# Patient Record
Sex: Female | Born: 1960
Health system: Southern US, Community
[De-identification: ages and names within clinical notes are randomized; demographics above are authoritative.]

## PROBLEM LIST (undated history)

## (undated) DIAGNOSIS — E119 Type 2 diabetes mellitus without complications: Secondary | ICD-10-CM

## (undated) DIAGNOSIS — K219 Gastro-esophageal reflux disease without esophagitis: Secondary | ICD-10-CM

## (undated) DIAGNOSIS — Z9889 Other specified postprocedural states: Secondary | ICD-10-CM

## (undated) DIAGNOSIS — M1712 Unilateral primary osteoarthritis, left knee: Secondary | ICD-10-CM

## (undated) DIAGNOSIS — Z8489 Family history of other specified conditions: Secondary | ICD-10-CM

## (undated) DIAGNOSIS — M1990 Unspecified osteoarthritis, unspecified site: Secondary | ICD-10-CM

## (undated) DIAGNOSIS — K279 Peptic ulcer, site unspecified, unspecified as acute or chronic, without hemorrhage or perforation: Secondary | ICD-10-CM

## (undated) DIAGNOSIS — D649 Anemia, unspecified: Secondary | ICD-10-CM

## (undated) DIAGNOSIS — R112 Nausea with vomiting, unspecified: Secondary | ICD-10-CM

## (undated) DIAGNOSIS — R202 Paresthesia of skin: Secondary | ICD-10-CM

## (undated) DIAGNOSIS — R200 Anesthesia of skin: Secondary | ICD-10-CM

## (undated) DIAGNOSIS — R160 Hepatomegaly, not elsewhere classified: Secondary | ICD-10-CM

## (undated) DIAGNOSIS — R002 Palpitations: Secondary | ICD-10-CM

## (undated) DIAGNOSIS — I10 Essential (primary) hypertension: Secondary | ICD-10-CM

## (undated) DIAGNOSIS — B9681 Helicobacter pylori [H. pylori] as the cause of diseases classified elsewhere: Secondary | ICD-10-CM

## (undated) DIAGNOSIS — K760 Fatty (change of) liver, not elsewhere classified: Secondary | ICD-10-CM

## (undated) HISTORY — PX: JOINT REPLACEMENT: SHX530

## (undated) HISTORY — PX: MOUTH SURGERY: SHX715

## (undated) HISTORY — PX: TUBAL LIGATION: SHX77

## (undated) HISTORY — PX: COLONOSCOPY: SHX174

## (undated) HISTORY — PX: BACK SURGERY: SHX140

## (undated) HISTORY — PX: CERVICAL FUSION: SHX112

## (undated) HISTORY — PX: TOTAL HIP ARTHROPLASTY: SHX124

## (undated) SURGERY — Surgical Case
Anesthesia: *Unknown

---

## 2006-05-05 ENCOUNTER — Ambulatory Visit: Payer: Self-pay | Admitting: Cardiology

## 2006-05-13 ENCOUNTER — Ambulatory Visit: Payer: Self-pay | Admitting: Cardiology

## 2006-05-17 ENCOUNTER — Ambulatory Visit: Payer: Self-pay | Admitting: Cardiology

## 2007-05-25 ENCOUNTER — Encounter: Admission: RE | Admit: 2007-05-25 | Discharge: 2007-05-25 | Payer: Self-pay | Admitting: Sports Medicine

## 2007-11-22 ENCOUNTER — Encounter: Admission: RE | Admit: 2007-11-22 | Discharge: 2007-11-22 | Payer: Self-pay | Admitting: Sports Medicine

## 2008-06-19 ENCOUNTER — Inpatient Hospital Stay (HOSPITAL_COMMUNITY): Admission: RE | Admit: 2008-06-19 | Discharge: 2008-06-24 | Payer: Self-pay | Admitting: Orthopedic Surgery

## 2009-07-05 ENCOUNTER — Ambulatory Visit: Payer: Self-pay | Admitting: Diagnostic Radiology

## 2009-07-05 ENCOUNTER — Emergency Department (HOSPITAL_BASED_OUTPATIENT_CLINIC_OR_DEPARTMENT_OTHER): Admission: EM | Admit: 2009-07-05 | Discharge: 2009-07-05 | Payer: Self-pay | Admitting: Emergency Medicine

## 2011-03-08 LAB — POCT CARDIAC MARKERS: CKMB, poc: <1 ng/mL — ABNORMAL LOW (ref 1.0–8.0)

## 2011-03-08 LAB — URINALYSIS, ROUTINE W REFLEX MICROSCOPIC
Bilirubin Urine: NEGATIVE
Ketones, ur: NEGATIVE mg/dL
Protein, ur: NEGATIVE mg/dL
Urobilinogen, UA: 0.2 mg/dL (ref 0.0–1.0)
pH: 6.5 (ref 5.0–8.0)

## 2011-03-08 LAB — CBC
Hemoglobin: 12.3 g/dL (ref 12.0–15.0)
MCHC: 33.3 g/dL (ref 30.0–36.0)
MCV: 88.4 fL (ref 78.0–100.0)
RBC: 4.17 MIL/uL (ref 3.87–5.11)
RDW: 13.3 % (ref 11.5–15.5)
WBC: 4.9 10*3/uL (ref 4.0–10.5)

## 2011-03-08 LAB — BASIC METABOLIC PANEL
CO2: 26 mEq/L (ref 19–32)
Chloride: 109 mEq/L (ref 96–112)
Creatinine, Ser: 0.8 mg/dL (ref 0.4–1.2)
GFR calc Af Amer: >60 mL/min (ref >60)
GFR calc non Af Amer: >60 mL/min (ref >60)

## 2011-03-08 LAB — URINE MICROSCOPIC-ADD ON

## 2011-03-08 LAB — POCT TOXICOLOGY PANEL

## 2011-03-08 LAB — DIFFERENTIAL
Basophils Absolute: 0 10*3/uL (ref 0.0–0.1)
Basophils Relative: 1 % (ref 0–1)
Eosinophils Absolute: 0.1 10*3/uL (ref 0.0–0.7)
Eosinophils Relative: 1 % (ref 0–5)
Lymphs Abs: 1.8 10*3/uL (ref 0.7–4.0)
Monocytes Absolute: 0.4 10*3/uL (ref 0.1–1.0)
Monocytes Relative: 8 % (ref 3–12)

## 2011-03-20 ENCOUNTER — Encounter: Payer: Self-pay | Admitting: Advanced Practice Midwife

## 2011-04-08 ENCOUNTER — Encounter: Payer: Self-pay | Admitting: Obstetrics and Gynecology

## 2011-04-14 NOTE — Discharge Summary (Signed)
Donna Casey NO.:  [id number]   MEDICAL RECORD NO.:  [id number]          PATIENT TYPE:  INP   LOCATION:  5009                         FACILITY:  [facility]   PHYSICIAN:  Eulas Post, MD    DATE OF BIRTH:  [birth year]   DATE OF ADMISSION:  06/19/2008  DATE OF DISCHARGE:  06/24/2008                               DISCHARGE SUMMARY   ADMISSION DIAGNOSIS:  Right hip posttraumatic arthritis.   DISCHARGE DIAGNOSES:  1. Right hip posttraumatic osteoarthritis.  2. Acute postoperative blood loss anemia.  3. Persistent tachycardia of unknown etiology.   DISCHARGE MEDICATIONS:  1. Coumadin 2.5 mg p.o. daily for a period of 1 month postoperatively      to be managed by Home Health Physical Therapy.  2. Phenergan.  3. Percocet.  4. Valium p.r.n.  5. Hydrochlorothiazide 1/2 tablet p.o. daily.   HOSPITAL COURSE:  Donna Casey is a 50 year old woman who had an  acetabular fracture and subsequently developed arthritis and elected to  undergo a right total hip arthroplasty.  She tolerated the procedure  well and postoperatively developed postoperative hemorrhagic anemia.  She had a blood transfusion for a hemoglobin of 8 and a hematocrit of 25  with a pulse of 125 and symptoms of dizziness and fatigue.  After her  transfusion, she felt better and her hemoglobin and hematocrit were 9  and 28.  She continued to have persistent tachycardia, which actually  was consistent with her preoperative exam with pulse ranging from the  110 to even as high as 135.  She never had any symptoms or had any chest  pain.  She was given Coumadin for DVT prophylaxis as well as Ancef for  antimicrobial prophylaxis.  Her dressings were changed on postoperative  day #3 and they were clean, dry, and intact.  She made slow progress  with physical therapy and is planned to be discharged home,  weightbearing as tolerated with Home Health and Physical Therapy.  She  initially had a PCA and  subsequently switched to p.o. analgesics.  I am  going to plan to see her again in 2 weeks in my office.  She has done  very well so far and she benefited maximally from her hospital stay.      Eulas Post, MD  Electronically Signed     JPL/MEDQ  D:  06/22/2008  T:  06/23/2008  Job:  [id number]

## 2011-04-14 NOTE — Op Note (Signed)
Donna Casey, Donna Casey NO.:  [id number]   MEDICAL RECORD NO.:  [id number]          PATIENT TYPE:  INP   LOCATION:  2550                         FACILITY:  [facility]   PHYSICIAN:  Eulas Post, MD    DATE OF BIRTH:  [birth year]   DATE OF PROCEDURE:  06/19/2008  DATE OF DISCHARGE:                               OPERATIVE REPORT   PREOPERATIVE DIAGNOSIS:  Right hip post-traumatic osteoarthritis.   POSTOPERATIVE DIAGNOSIS:  Right hip post-traumatic osteoarthritis.   OPERATIVE PROCEDURE:  Right total hip arthroplasty.   ATTENDING PHYSICIAN:  Eulas Post, MD   FIRST ASSISTANT:  Skip Mayer, PA-C   ANESTHESIA:  General.   ESTIMATED BLOOD LOSS:  450 mL.   OPERATIVE IMPLANT:  A DePuy Pinnacle 100 acetabular cup, size 54 with a  Marathon acetabular liner plus four 10-degree lip size 54 x 36 with a  size 5 Summit tapered femoral stem HA along with a size 36-mm plus 1.5  metal-on-metal Articuleze femoral head with a hole eliminator for the  acetabulum.   PREOPERATIVE INDICATIONS:  Mrs. Donna Casey is a 50 year old woman  who had an acetabular fracture and subsequently developed severe post-  traumatic arthritis and pain.  She elected to undergo the above-named  procedures.  Risks, benefits, and alternatives to operative intervention  were discussed with her and her family preoperatively including not  limited to risks of infection, bleeding, nerve injury, fracture, the  need for revision surgery, leg-length discrepancy, failure of operative  implant, cardiopulmonary complications, among others, and she is willing  to proceed.   OPERATIVE PROCEDURE:  The patient was brought to the operating room,  placed in the supine position.  General anesthesia was administered.  1  g intravenous Ancef was given.  The patient was turned into the lateral  decubitus position and the right lower extremity was prepped and draped  in the usual sterile fashion.  Standard  approach was performed.  The  capsule was tacked with Ethibond and FiberWire and the piriformis was  also tacked.  Posterolateral approach was performed.  We then dislocated  the hip and took a measurement before dislocating the hip which to an  arbitrary reference was 10.5.  At the end of the case that length  measurement was then 11.0 mm indicating that we had lengthened our goal  amount of approximately 5 mm.   She had a fairly impressive amount of adipose tissue and it was a very  deep hole.  We performed our femoral neck resection using the  appropriate template.  We then exposed the acetabulum and removed the  ossified labrum.  There was a moderate amount of distortion of the  osseous anatomy due to her previous fracture, and she had a falsely  lateralized acetabulum due to some mild heterotopic ossification around  the rim of the acetabulum.  We removed all of this with rongeurs as well  as osteotomes in order to get back to a normal acetabulum as best as  possible.  We reamed medially in order to reach the inner wall and then  reamed up to a size  53 reamer and trialed the 54 cup.  Excellent  fixation was achieved with the trial liner, so we decided to go with a  implant without any holes.   We impacted our implant into the appropriate position with appropriate  anteversion and inclination.  We had excellent stability of the implant  and then placed a trial lipped liner and turned our attention to the  femur.   We exposed the femur and lateralized after removing the lateral bone of  the trochanter in order to get the appropriate position.  We  sequentially lateralized up to a size 5.  We then broached up to a size  5 and trialed with the 5.  With +1.5 length, we had excellent stability  in both in anterior and posterior positions.   We then impacted the 5 femoral stem into place and then trialed one last  time with the ball and it had excellent stability.  The stem seated   completely.  There were no calcar cracks.  We then removed the trial  liner for the acetabulum and placed the final liner with the lip in the  posterior superior position.  We then impacted our metal femoral head  and reduced the hip and took it through one final range of motion.  It  was found to have excellent stability.  We irrigated the wounds  copiously and closed the capsule with interrupted FiberWire as well as  Ethibond and sewed the capsular piriformis external rotator sleeve  through the greater trochanter through drill holes using the smallest  drill possible.  We then closed the fascia with #1 Vicryl followed by 2-  0 and 3-0 Vicryl for the subcutaneous tissue and a Monocryl for the  skin.  The wounds were injected.  Steri-Strips were placed.  The patient  was placed in an abduction pillow and sterile gauze applied.  The  patient was awakened and extubated and returned to PACU in stable and  satisfactory condition.  There were no complications and the patient  tolerated the procedure well.      Eulas Post, MD  Electronically Signed     JPL/MEDQ  D:  06/19/2008  T:  06/20/2008  Job:  [id number]

## 2011-05-20 ENCOUNTER — Encounter: Payer: Self-pay | Admitting: Obstetrics and Gynecology

## 2011-08-28 LAB — ABO/RH: ABO/RH(D): O POS

## 2011-08-28 LAB — PROTIME-INR
INR: 1.1
INR: 1.1
INR: 1.4
INR: 1.7 — ABNORMAL HIGH
INR: 2 — ABNORMAL HIGH
Prothrombin Time: 17.4 — ABNORMAL HIGH
Prothrombin Time: 20.9 — ABNORMAL HIGH
Prothrombin Time: 23.8 — ABNORMAL HIGH
Prothrombin Time: 24.6 — ABNORMAL HIGH

## 2011-08-28 LAB — CBC
HCT: 25.4 — ABNORMAL LOW
Hemoglobin: 12.5
Hemoglobin: 8.6 — ABNORMAL LOW
Hemoglobin: 9 — ABNORMAL LOW
MCHC: 34.1
MCHC: 34.8
MCV: 87.1
MCV: 87.3
Platelets: 273
Platelets: 294
RBC: 2.91 — ABNORMAL LOW
RBC: 3.24 — ABNORMAL LOW
RBC: 4.15
RDW: 13.8
RDW: 14.1
WBC: 10.1
WBC: 10.4
WBC: 9.9

## 2011-08-28 LAB — BASIC METABOLIC PANEL
BUN: 10
CO2: 22
Calcium: 7.6 — ABNORMAL LOW
Calcium: 7.8 — ABNORMAL LOW
Chloride: 101
Chloride: 103
Creatinine, Ser: 1.02
GFR calc Af Amer: >60
GFR calc Af Amer: >60
GFR calc Af Amer: >60
GFR calc non Af Amer: 58 — ABNORMAL LOW
GFR calc non Af Amer: 59 — ABNORMAL LOW
GFR calc non Af Amer: >60
GFR calc non Af Amer: >60
Glucose, Bld: 134 — ABNORMAL HIGH
Glucose, Bld: 156 — ABNORMAL HIGH
Glucose, Bld: 93
Potassium: 3.6
Potassium: 3.8
Sodium: 126 — ABNORMAL LOW

## 2011-08-28 LAB — CROSSMATCH: Antibody Screen: NEGATIVE

## 2011-08-28 LAB — TYPE AND SCREEN: Antibody Screen: NEGATIVE

## 2016-03-20 DIAGNOSIS — R42 Dizziness and giddiness: Secondary | ICD-10-CM | POA: Diagnosis not present

## 2016-03-20 DIAGNOSIS — I10 Essential (primary) hypertension: Secondary | ICD-10-CM | POA: Diagnosis not present

## 2016-03-20 DIAGNOSIS — Z833 Family history of diabetes mellitus: Secondary | ICD-10-CM | POA: Diagnosis not present

## 2016-03-20 DIAGNOSIS — R0789 Other chest pain: Secondary | ICD-10-CM | POA: Diagnosis not present

## 2016-03-20 DIAGNOSIS — Z841 Family history of disorders of kidney and ureter: Secondary | ICD-10-CM | POA: Diagnosis not present

## 2016-03-20 DIAGNOSIS — M549 Dorsalgia, unspecified: Secondary | ICD-10-CM | POA: Diagnosis not present

## 2016-03-20 DIAGNOSIS — R51 Headache: Secondary | ICD-10-CM | POA: Diagnosis not present

## 2016-03-20 DIAGNOSIS — R079 Chest pain, unspecified: Secondary | ICD-10-CM | POA: Diagnosis not present

## 2016-03-20 DIAGNOSIS — Z8249 Family history of ischemic heart disease and other diseases of the circulatory system: Secondary | ICD-10-CM | POA: Diagnosis not present

## 2016-03-20 DIAGNOSIS — Z96649 Presence of unspecified artificial hip joint: Secondary | ICD-10-CM | POA: Diagnosis not present

## 2016-03-20 DIAGNOSIS — Z809 Family history of malignant neoplasm, unspecified: Secondary | ICD-10-CM | POA: Diagnosis not present

## 2016-03-20 DIAGNOSIS — Z79899 Other long term (current) drug therapy: Secondary | ICD-10-CM | POA: Diagnosis not present

## 2016-03-20 DIAGNOSIS — R200 Anesthesia of skin: Secondary | ICD-10-CM | POA: Diagnosis not present

## 2016-03-20 DIAGNOSIS — M542 Cervicalgia: Secondary | ICD-10-CM | POA: Diagnosis not present

## 2016-03-20 DIAGNOSIS — Z825 Family history of asthma and other chronic lower respiratory diseases: Secondary | ICD-10-CM | POA: Diagnosis not present

## 2016-03-23 DIAGNOSIS — Z6838 Body mass index (BMI) 38.0-38.9, adult: Secondary | ICD-10-CM | POA: Diagnosis not present

## 2016-03-23 DIAGNOSIS — M940 Chondrocostal junction syndrome [Tietze]: Secondary | ICD-10-CM | POA: Diagnosis not present

## 2016-03-23 DIAGNOSIS — R072 Precordial pain: Secondary | ICD-10-CM | POA: Diagnosis not present

## 2016-03-23 DIAGNOSIS — M542 Cervicalgia: Secondary | ICD-10-CM | POA: Diagnosis not present

## 2016-04-06 DIAGNOSIS — R072 Precordial pain: Secondary | ICD-10-CM | POA: Diagnosis not present

## 2016-04-06 DIAGNOSIS — M542 Cervicalgia: Secondary | ICD-10-CM | POA: Diagnosis not present

## 2016-04-06 DIAGNOSIS — M940 Chondrocostal junction syndrome [Tietze]: Secondary | ICD-10-CM | POA: Diagnosis not present

## 2016-04-06 DIAGNOSIS — I10 Essential (primary) hypertension: Secondary | ICD-10-CM | POA: Diagnosis not present

## 2016-04-06 DIAGNOSIS — Z1389 Encounter for screening for other disorder: Secondary | ICD-10-CM | POA: Diagnosis not present

## 2016-04-24 DIAGNOSIS — I10 Essential (primary) hypertension: Secondary | ICD-10-CM | POA: Diagnosis not present

## 2016-04-24 DIAGNOSIS — R739 Hyperglycemia, unspecified: Secondary | ICD-10-CM | POA: Diagnosis not present

## 2016-04-29 DIAGNOSIS — Z1212 Encounter for screening for malignant neoplasm of rectum: Secondary | ICD-10-CM | POA: Diagnosis not present

## 2016-04-29 DIAGNOSIS — N63 Unspecified lump in breast: Secondary | ICD-10-CM | POA: Diagnosis not present

## 2016-04-29 DIAGNOSIS — Z23 Encounter for immunization: Secondary | ICD-10-CM | POA: Diagnosis not present

## 2016-04-29 DIAGNOSIS — Z0000 Encounter for general adult medical examination without abnormal findings: Secondary | ICD-10-CM | POA: Diagnosis not present

## 2016-04-29 DIAGNOSIS — Z6838 Body mass index (BMI) 38.0-38.9, adult: Secondary | ICD-10-CM | POA: Diagnosis not present

## 2016-05-11 DIAGNOSIS — R7301 Impaired fasting glucose: Secondary | ICD-10-CM | POA: Diagnosis not present

## 2016-05-11 DIAGNOSIS — D250 Submucous leiomyoma of uterus: Secondary | ICD-10-CM | POA: Diagnosis not present

## 2016-05-11 DIAGNOSIS — R1310 Dysphagia, unspecified: Secondary | ICD-10-CM | POA: Diagnosis not present

## 2016-05-11 DIAGNOSIS — Z1212 Encounter for screening for malignant neoplasm of rectum: Secondary | ICD-10-CM | POA: Diagnosis not present

## 2016-05-11 DIAGNOSIS — K219 Gastro-esophageal reflux disease without esophagitis: Secondary | ICD-10-CM | POA: Diagnosis not present

## 2016-05-11 DIAGNOSIS — D252 Subserosal leiomyoma of uterus: Secondary | ICD-10-CM | POA: Diagnosis not present

## 2016-05-11 DIAGNOSIS — Z0000 Encounter for general adult medical examination without abnormal findings: Secondary | ICD-10-CM | POA: Diagnosis not present

## 2016-06-08 DIAGNOSIS — N920 Excessive and frequent menstruation with regular cycle: Secondary | ICD-10-CM | POA: Diagnosis not present

## 2016-06-08 DIAGNOSIS — D250 Submucous leiomyoma of uterus: Secondary | ICD-10-CM | POA: Diagnosis not present

## 2016-06-15 DIAGNOSIS — D259 Leiomyoma of uterus, unspecified: Secondary | ICD-10-CM | POA: Diagnosis not present

## 2016-06-15 DIAGNOSIS — Z6837 Body mass index (BMI) 37.0-37.9, adult: Secondary | ICD-10-CM | POA: Diagnosis not present

## 2016-06-19 DIAGNOSIS — Z6838 Body mass index (BMI) 38.0-38.9, adult: Secondary | ICD-10-CM | POA: Diagnosis not present

## 2016-06-19 DIAGNOSIS — R1084 Generalized abdominal pain: Secondary | ICD-10-CM | POA: Diagnosis not present

## 2016-06-22 ENCOUNTER — Other Ambulatory Visit: Payer: Self-pay | Admitting: Obstetrics and Gynecology

## 2016-06-23 ENCOUNTER — Other Ambulatory Visit: Payer: Self-pay | Admitting: Obstetrics and Gynecology

## 2016-06-23 DIAGNOSIS — R1084 Generalized abdominal pain: Secondary | ICD-10-CM | POA: Diagnosis not present

## 2016-06-29 NOTE — Patient Instructions (Addendum)
Your procedure is scheduled on:  Tuesday, July 14, 2016  Enter through the Main Entrance of [facility] at:  10:45 AM  Pick up the phone at the desk and dial [phone].  Call this number if you have problems the morning of surgery: [phone].  Remember: Do NOT eat food:  After Midnight Monday  Do NOT drink clear liquids after:  8:00 AM day of surgery  Take these medicines the morning of surgery with a SIP OF WATER: Blood pressure medications  Do NOT wear jewelry (body piercing), metal hair clips/bobby pins, make-up, or nail polish. Do NOT wear lotions, powders, or perfumes.  You may wear deodorant. Do NOT shave for 48 hours prior to surgery. Do NOT bring valuables to the hospital. Contacts, dentures, or bridgework may not be worn into surgery.  Have a responsible adult drive you home and stay with you for 24 hours after your procedure

## 2016-06-30 ENCOUNTER — Encounter (HOSPITAL_COMMUNITY): Payer: Self-pay

## 2016-06-30 ENCOUNTER — Encounter (HOSPITAL_COMMUNITY)
Admission: RE | Admit: 2016-06-30 | Discharge: 2016-06-30 | Disposition: A | Discharge status: Home / Self Care | Payer: BLUE CROSS/BLUE SHIELD | Source: Ambulatory Visit | Attending: Obstetrics and Gynecology | Admitting: Obstetrics and Gynecology

## 2016-06-30 DIAGNOSIS — Z01812 Encounter for preprocedural laboratory examination: Secondary | ICD-10-CM | POA: Insufficient documentation

## 2016-06-30 HISTORY — DX: Essential (primary) hypertension: I10

## 2016-06-30 HISTORY — DX: Anesthesia of skin: R20.0

## 2016-06-30 HISTORY — DX: Anemia, unspecified: D64.9

## 2016-06-30 HISTORY — DX: Peptic ulcer, site unspecified, unspecified as acute or chronic, without hemorrhage or perforation: K27.9

## 2016-06-30 HISTORY — DX: Other specified postprocedural states: Z98.890

## 2016-06-30 HISTORY — DX: Peptic ulcer, site unspecified, unspecified as acute or chronic, without hemorrhage or perforation: B96.81

## 2016-06-30 HISTORY — DX: Hepatomegaly, not elsewhere classified: R16.0

## 2016-06-30 HISTORY — DX: Anesthesia of skin: R20.2

## 2016-06-30 HISTORY — DX: Gastro-esophageal reflux disease without esophagitis: K21.9

## 2016-06-30 HISTORY — DX: Palpitations: R00.2

## 2016-06-30 HISTORY — DX: Other specified postprocedural states: R11.2

## 2016-06-30 LAB — BASIC METABOLIC PANEL
Anion gap: 4 — ABNORMAL LOW (ref 5–15)
BUN: 11 mg/dL (ref 6–20)
CHLORIDE: 105 mmol/L (ref 101–111)
CO2: 28 mmol/L (ref 22–32)
CREATININE: 0.82 mg/dL (ref 0.44–1.00)
Calcium: 8.8 mg/dL — ABNORMAL LOW (ref 8.9–10.3)
GFR calc Af Amer: >60 mL/min (ref >60)
GLUCOSE: 136 mg/dL — AB (ref 65–99)
POTASSIUM: 3.6 mmol/L (ref 3.5–5.1)
SODIUM: 137 mmol/L (ref 135–145)

## 2016-06-30 LAB — CBC
HCT: 35.6 % — ABNORMAL LOW (ref 36.0–46.0)
Hemoglobin: 11.3 g/dL — ABNORMAL LOW (ref 12.0–15.0)
MCH: 27.9 pg (ref 26.0–34.0)
MCHC: 31.7 g/dL (ref 30.0–36.0)
MCV: 87.9 fL (ref 78.0–100.0)
PLATELETS: 354 10*3/uL (ref 150–400)
RBC: 4.05 MIL/uL (ref 3.87–5.11)
RDW: 14 % (ref 11.5–15.5)
WBC: 8.4 10*3/uL (ref 4.0–10.5)

## 2016-07-13 NOTE — Anesthesia Preprocedure Evaluation (Addendum)
Anesthesia Evaluation  Patient identified by MRN, date of birth, ID band Patient awake    Reviewed: Allergy & Precautions, NPO status , Patient's Chart, lab work & pertinent test results  History of Anesthesia Complications (+) PONV and history of anesthetic complications  Airway Mallampati: I  TM Distance: >3 FB Neck ROM: Full    Dental  (+) Dental Advisory Given, Teeth Intact   Pulmonary neg pulmonary ROS,    Pulmonary exam normal breath sounds clear to auscultation       Cardiovascular hypertension, Pt. on medications (-) angina(-) Past MI, (-) Cardiac Stents and (-) CABG + dysrhythmias (palpitations)  Rhythm:Regular Rate:Normal     Neuro/Psych neg Seizures Numbness and tingling in right hand     GI/Hepatic PUD, GERD  Medicated and Controlled,Hepatomegaly   Endo/Other  negative endocrine ROS  Renal/GU negative Renal ROS     Musculoskeletal   Abdominal (+) + obese,   Peds  Hematology  (+) Blood dyscrasia, anemia ,   Anesthesia Other Findings   Reproductive/Obstetrics                            Anesthesia Physical Anesthesia Plan  ASA: II  Anesthesia Plan: General   Post-op Pain Management:    Induction: Intravenous  Airway Management Planned: LMA  Additional Equipment:   Intra-op Plan:   Post-operative Plan: Extubation in OR  Informed Consent: I have reviewed the patients History and Physical, chart, labs and discussed the procedure including the risks, benefits and alternatives for the proposed anesthesia with the patient or authorized representative who has indicated his/her understanding and acceptance.   Dental advisory given  Plan Discussed with:   Anesthesia Plan Comments:        Anesthesia Quick Evaluation

## 2016-07-14 ENCOUNTER — Ambulatory Visit (HOSPITAL_COMMUNITY)
Admission: RE | Admit: 2016-07-14 | Discharge: 2016-07-14 | Disposition: A | Discharge status: Home / Self Care | Payer: BLUE CROSS/BLUE SHIELD | Source: Ambulatory Visit | Attending: Obstetrics and Gynecology | Admitting: Obstetrics and Gynecology

## 2016-07-14 ENCOUNTER — Encounter (HOSPITAL_COMMUNITY)
Admission: RE | Disposition: A | Discharge status: Home / Self Care | Payer: Self-pay | Source: Ambulatory Visit | Attending: Obstetrics and Gynecology

## 2016-07-14 ENCOUNTER — Ambulatory Visit (HOSPITAL_COMMUNITY): Payer: BLUE CROSS/BLUE SHIELD | Admitting: Anesthesiology

## 2016-07-14 ENCOUNTER — Encounter (HOSPITAL_COMMUNITY): Payer: Self-pay | Admitting: Anesthesiology

## 2016-07-14 DIAGNOSIS — N920 Excessive and frequent menstruation with regular cycle: Secondary | ICD-10-CM | POA: Insufficient documentation

## 2016-07-14 DIAGNOSIS — N926 Irregular menstruation, unspecified: Secondary | ICD-10-CM | POA: Diagnosis not present

## 2016-07-14 DIAGNOSIS — I10 Essential (primary) hypertension: Secondary | ICD-10-CM | POA: Insufficient documentation

## 2016-07-14 DIAGNOSIS — D259 Leiomyoma of uterus, unspecified: Secondary | ICD-10-CM | POA: Diagnosis not present

## 2016-07-14 DIAGNOSIS — K219 Gastro-esophageal reflux disease without esophagitis: Secondary | ICD-10-CM | POA: Insufficient documentation

## 2016-07-14 HISTORY — PX: DILITATION & CURRETTAGE/HYSTROSCOPY WITH NOVASURE ABLATION: SHX5568

## 2016-07-14 LAB — PREGNANCY, URINE: Preg Test, Ur: NEGATIVE

## 2016-07-14 SURGERY — DILATATION & CURETTAGE/HYSTEROSCOPY WITH NOVASURE ABLATION
Anesthesia: General | Site: Vagina

## 2016-07-14 MED ORDER — IBUPROFEN 800 MG PO TABS
800.0000 mg | ORAL_TABLET | Freq: Once | ORAL | Status: AC
  Administered 2016-07-14: 800 mg via ORAL

## 2016-07-14 MED ORDER — LIDOCAINE HCL (CARDIAC) 20 MG/ML IV SOLN
INTRAVENOUS | Status: AC
  Filled 2016-07-14: qty 5

## 2016-07-14 MED ORDER — LIDOCAINE HCL 1 % IJ SOLN
INTRAMUSCULAR | Status: DC | PRN
  Administered 2016-07-14: 10 mL

## 2016-07-14 MED ORDER — FENTANYL CITRATE (PF) 250 MCG/5ML IJ SOLN
INTRAMUSCULAR | Status: AC
  Filled 2016-07-14: qty 5

## 2016-07-14 MED ORDER — SCOPOLAMINE 1 MG/3DAYS TD PT72
MEDICATED_PATCH | TRANSDERMAL | Status: AC
  Administered 2016-07-14: 1.5 mg via TRANSDERMAL
  Filled 2016-07-14: qty 1

## 2016-07-14 MED ORDER — KETOROLAC TROMETHAMINE 30 MG/ML IJ SOLN
INTRAMUSCULAR | Status: AC
  Filled 2016-07-14: qty 1

## 2016-07-14 MED ORDER — LIDOCAINE HCL (CARDIAC) 20 MG/ML IV SOLN
INTRAVENOUS | Status: DC | PRN
  Administered 2016-07-14: 100 mg via INTRAVENOUS

## 2016-07-14 MED ORDER — ONDANSETRON HCL 4 MG/2ML IJ SOLN
INTRAMUSCULAR | Status: AC
  Filled 2016-07-14: qty 2

## 2016-07-14 MED ORDER — PROMETHAZINE HCL 25 MG/ML IJ SOLN: 6.2500 mg | INTRAMUSCULAR | Status: DC | PRN

## 2016-07-14 MED ORDER — PROPOFOL 10 MG/ML IV BOLUS
INTRAVENOUS | Status: DC | PRN
  Administered 2016-07-14: 200 mg via INTRAVENOUS

## 2016-07-14 MED ORDER — DEXAMETHASONE SODIUM PHOSPHATE 4 MG/ML IJ SOLN
INTRAMUSCULAR | Status: AC
  Filled 2016-07-14: qty 1

## 2016-07-14 MED ORDER — LIDOCAINE HCL 1 % IJ SOLN
INTRAMUSCULAR | Status: AC
  Filled 2016-07-14: qty 20

## 2016-07-14 MED ORDER — PROPOFOL 10 MG/ML IV BOLUS
INTRAVENOUS | Status: AC
  Filled 2016-07-14: qty 20

## 2016-07-14 MED ORDER — LACTATED RINGERS IR SOLN
Status: DC | PRN
  Administered 2016-07-14: 3000 mL

## 2016-07-14 MED ORDER — FENTANYL CITRATE (PF) 100 MCG/2ML IJ SOLN
INTRAMUSCULAR | Status: DC | PRN
  Administered 2016-07-14 (×2): 50 ug via INTRAVENOUS

## 2016-07-14 MED ORDER — FENTANYL CITRATE (PF) 100 MCG/2ML IJ SOLN
25.0000 ug | INTRAMUSCULAR | Status: DC | PRN
  Administered 2016-07-14: 25 ug via INTRAVENOUS

## 2016-07-14 MED ORDER — SCOPOLAMINE 1 MG/3DAYS TD PT72
1.0000 | MEDICATED_PATCH | Freq: Once | TRANSDERMAL | Status: DC
  Administered 2016-07-14: 1.5 mg via TRANSDERMAL

## 2016-07-14 MED ORDER — DEXAMETHASONE SODIUM PHOSPHATE 10 MG/ML IJ SOLN
INTRAMUSCULAR | Status: DC | PRN
  Administered 2016-07-14: 4 mg via INTRAVENOUS

## 2016-07-14 MED ORDER — FENTANYL CITRATE (PF) 100 MCG/2ML IJ SOLN
INTRAMUSCULAR | Status: AC
  Administered 2016-07-14: 25 ug via INTRAVENOUS
  Filled 2016-07-14: qty 2

## 2016-07-14 MED ORDER — CEFAZOLIN SODIUM-DEXTROSE 2-4 GM/100ML-% IV SOLN
2.0000 g | INTRAVENOUS | Status: AC
  Administered 2016-07-14: 2 g via INTRAVENOUS

## 2016-07-14 MED ORDER — IBUPROFEN 600 MG PO TABS
ORAL_TABLET | ORAL | Status: AC
  Filled 2016-07-14: qty 1

## 2016-07-14 MED ORDER — ONDANSETRON HCL 4 MG/2ML IJ SOLN
INTRAMUSCULAR | Status: DC | PRN
  Administered 2016-07-14: 4 mg via INTRAVENOUS

## 2016-07-14 MED ORDER — LACTATED RINGERS IV SOLN
INTRAVENOUS | Status: DC
Start: 2016-07-14 — End: 2016-07-14
  Administered 2016-07-14 (×2): via INTRAVENOUS

## 2016-07-14 MED ORDER — MIDAZOLAM HCL 2 MG/2ML IJ SOLN
INTRAMUSCULAR | Status: AC
  Filled 2016-07-14: qty 2

## 2016-07-14 MED ORDER — MIDAZOLAM HCL 2 MG/2ML IJ SOLN
INTRAMUSCULAR | Status: DC | PRN
  Administered 2016-07-14 (×2): 1 mg via INTRAVENOUS

## 2016-07-14 SURGICAL SUPPLY — 14 items
ABLATOR ENDOMETRIAL BIPOLAR (ABLATOR) ×3 IMPLANT
CATH ROBINSON RED A/P 16FR (CATHETERS) ×3 IMPLANT
CLOTH BEACON ORANGE TIMEOUT ST (SAFETY) ×3 IMPLANT
CONTAINER PREFILL 10% NBF 60ML (FORM) IMPLANT
GLOVE BIOGEL PI IND STRL 7.0 (GLOVE) ×1 IMPLANT
GLOVE BIOGEL PI INDICATOR 7.0 (GLOVE) ×2
GLOVE ECLIPSE 7.0 STRL STRAW (GLOVE) ×6 IMPLANT
GOWN STRL REUS W/TWL LRG LVL3 (GOWN DISPOSABLE) ×6 IMPLANT
PACK VAGINAL MINOR WOMEN LF (CUSTOM PROCEDURE TRAY) ×3 IMPLANT
PAD OB MATERNITY 4.3X12.25 (PERSONAL CARE ITEMS) ×3 IMPLANT
TOWEL OR 17X24 6PK STRL BLUE (TOWEL DISPOSABLE) ×6 IMPLANT
TUBING AQUILEX INFLOW (TUBING) ×3 IMPLANT
TUBING AQUILEX OUTFLOW (TUBING) ×3 IMPLANT
WATER STERILE IRR 1000ML POUR (IV SOLUTION) ×3 IMPLANT

## 2016-07-14 NOTE — Discharge Instructions (Signed)
DISCHARGE INSTRUCTIONS: HYSTEROSCOPY / ENDOMETRIAL ABLATION The following instructions have been prepared to help you care for yourself upon your return home.  May Remove Scop patch on or before  May take stool softner while taking narcotic pain medication to prevent constipation.  Drink plenty of water.  Personal hygiene:  Use sanitary pads for vaginal drainage, not tampons.  Shower the day after your procedure.  NO tub baths, pools or Jacuzzis for 2-3 weeks.  Wipe front to back after using the bathroom.  Activity and limitations:  Do NOT drive or operate any equipment for 24 hours. The effects of anesthesia are still present and drowsiness may result.  Do NOT rest in bed all day.  Walking is encouraged.  Walk up and down stairs slowly.  You may resume your normal activity in one to two days or as indicated by your physician. Sexual activity: NO intercourse for at least 2 weeks after the procedure, or as indicated by your Doctor.  Diet: Eat a light meal as desired this evening. You may resume your usual diet tomorrow.  Return to Work: You may resume your work activities in one to two days or as indicated by Therapist, sportsyour Doctor.  What to expect after your surgery: Expect to have vaginal bleeding/discharge for 2-3 days and spotting for up to 10 days. It is not unusual to have soreness for up to 1-2 weeks. You may have a slight burning sensation when you urinate for the first day. Mild cramps may continue for a couple of days. You may have a regular period in 2-6 weeks.  Call your doctor for any of the following:  Excessive vaginal bleeding or clotting, saturating and changing one pad every hour.  Inability to urinate 6 hours after discharge from hospital.  Pain not relieved by pain medication.  Fever of 100.4 F or greater.  Unusual vaginal discharge or odor.  Return to office _________________Call for an appointment ___________________ Patients signature:  ______________________ Nurses signature ________________________  Post Anesthesia Care Unit [phone]

## 2016-07-14 NOTE — H&P (Signed)
Donna Casey is an 55 y.o. black female who presents for a hysteroscopy/D&C/Ablation for menorrhagia.She also has fibroids.She has a nl sonohystogram.    Chief Complaint: HPI:  Past Medical History:  Diagnosis Date  . Anemia   . Enlarged liver   . GERD (gastroesophageal reflux disease)   . H pylori ulcer   . Heart palpitations   . Hypertension   . Numbness and tingling in hands    right hand  . PONV (postoperative nausea and vomiting)     Past Surgical History:  Procedure Laterality Date  . BACK SURGERY     disk removal  . COLONOSCOPY    . MOUTH SURGERY    . TOTAL HIP ARTHROPLASTY Right   . TUBAL LIGATION      History reviewed. No pertinent family history. Social History:  reports that she has never smoked. She has never used smokeless tobacco. She reports that she does not drink alcohol or use drugs.  Allergies:  Allergies  Allergen Reactions  . Oxycodone Nausea And Vomiting    Medications Prior to Admission  Medication Sig Dispense Refill  . amLODipine (NORVASC) 5 MG tablet Take 5 mg by mouth daily.  12  . ferrous sulfate 325 (65 FE) MG tablet Take 325 mg by mouth daily with breakfast.    . hydrochlorothiazide (HYDRODIURIL) 25 MG tablet Take 25 mg by mouth daily.  0  . omeprazole (PRILOSEC) 20 MG capsule Take 20 mg by mouth daily.  4      Blood pressure (!) 122/94, pulse 95, temperature 98.3 F (36.8 C), temperature source Oral, resp. rate 20, last menstrual period 06/06/2016, SpO2 98 %. BP (!) 122/94   Pulse 95   Temp 98.3 F (36.8 C) (Oral)   Resp 20   LMP 06/06/2016 (Exact Date)   SpO2 98%  Lungs: clear to auscultation bilaterally Abdomen: soft, non-tender; bowel sounds normal; no masses,  no organomegaly   Lab Results  Component Value Date   WBC 8.4 06/30/2016   HGB 11.3 (L) 06/30/2016   HCT 35.6 (L) 06/30/2016   MCV 87.9 06/30/2016   PLT 354 06/30/2016   Lab Results  Component Value Date   PREGTESTUR NEGATIVE 07/14/2016       There  are no active problems to display for this patient.  IMP/ menorrhagia         Fibroids Plan/ Proceed to OR for surgery  Genieve Ramaswamy E 07/14/2016, 11:49 AM

## 2016-07-14 NOTE — Op Note (Signed)
Donna Casey, Donna Casey             ACCOUNT NO.:  [id number]  MEDICAL RECORD NO.:  [id number]  LOCATION:  [facility]                          FACILITY:  [facility]  PHYSICIAN:  Malva LimesMark Anderson, M.D.    DATE OF BIRTH:  08-21-1961  DATE OF PROCEDURE:  07/14/2016 DATE OF DISCHARGE:                              OPERATIVE REPORT   PREOPERATIVE DIAGNOSES: 1. Menorrhagia. 2. Uterine fibroids.  POSTOPERATIVE DIAGNOSES: 1. Menorrhagia. 2. Uterine fibroids.  PROCEDURES: 1. Hysteroscopy. 2. Dilation and curettage. 3. NovaSure endometrial ablation.  SURGEON:  Malva LimesMark Anderson, M.D.  ANESTHESIA:  General with local.  ANTIBIOTICS:  Ancef 2 g.  DRAINS:  Red rubber catheter to bladder.  SPECIMENS:  Endometrial curettings sent to pathology.  COMPLICATIONS:  None.  FINDINGS:  The patient had a normal endocervical canal.  Her uterine cavity had no evidence of any submucous fibroids or polyps.  Both ostia were easily visualized.  PROCEDURE IN DETAIL:  The patient was taken to the operative suite, where general anesthetic was administered without difficulty.  She was then placed in the dorsal lithotomy position.  She was prepped and draped in the usual fashion for this procedure.  Exam under anesthesia revealed an anteverted uterus, irregular consistent with uterine fibroids.  A sterile speculum was placed in the vagina.  A 10 mL of 1% lidocaine was used for paracervical block.  A single-tooth tenaculum was applied to the anterior cervical lip.  The cervical os was then serially dilated to a 27-French.  The uterus was sounded to 9 cm.  The endocervical canal was 3 cm.  The hysteroscope was advanced to the uterine cavity and inserted into the cervix which appeared to be normal. On entering the uterine cavity, there was no evidence of any abnormalities.  Both ostia were visualized.  Following this, the camera was removed.  Sharp curettage was performed and the tissue sent for examination.  The NovaSure  device was then placed into the uterine cavity and opened.  The width was 4.3 cm.  A seal test was performed and passed. The device was then turned on for 1 minute and 15 seconds.  The patient tolerated the procedure well.  The device was removed.  The patient was awoken and taken to recovery room in stable condition.  Instrument and lap counts were correct x2.  The patient will be discharged to home. She will follow up in the office in 4 weeks.          ______________________________ Malva LimesMark Anderson, M.D.     MA/MEDQ  D:  07/14/2016  T:  07/14/2016  Job:  [id number]

## 2016-07-14 NOTE — Anesthesia Postprocedure Evaluation (Signed)
Anesthesia Post Note  Patient: Alverda Skeansnnette S Bagnell  Procedure(s) Performed: Procedure(s) (LRB): DILATATION & CURETTAGE/HYSTEROSCOPY WITH NOVASURE ABLATION (N/A)  Patient location during evaluation: PACU Anesthesia Type: General Level of consciousness: awake and alert Pain management: pain level controlled Vital Signs Assessment: post-procedure vital signs reviewed and stable Respiratory status: spontaneous breathing, nonlabored ventilation and respiratory function stable Cardiovascular status: blood pressure returned to baseline and stable Postop Assessment: no signs of nausea or vomiting Anesthetic complications: no     Last Vitals:  Vitals:   07/14/16 1315 07/14/16 1330  BP: 127/89   Pulse: 74 79  Resp: 14 14  Temp:      Last Pain:  Vitals:   07/14/16 1330  TempSrc:   PainSc: Asleep   Pain Goal: Patients Stated Pain Goal: 5 (07/14/16 1106)               Linton RumpJennifer Dickerson Jadis Mika

## 2016-07-14 NOTE — Anesthesia Procedure Notes (Signed)
Procedure Name: LMA Insertion Date/Time: 07/14/2016 12:11 PM Performed by: Earmon PhoenixWILKERSON, Shalona Harbour P Pre-anesthesia Checklist: Patient identified, Emergency Drugs available, Suction available, Patient being monitored and Timeout performed Patient Re-evaluated:Patient Re-evaluated prior to inductionOxygen Delivery Method: Circle system utilized Preoxygenation: Pre-oxygenation with 100% oxygen Intubation Type: IV induction Ventilation: Mask ventilation without difficulty LMA: LMA inserted and LMA with gastric port inserted LMA Size: 4.0 Number of attempts: 1 Placement Confirmation: positive ETCO2,  CO2 detector and breath sounds checked- equal and bilateral Tube secured with: Tape Dental Injury: Teeth and Oropharynx as per pre-operative assessment

## 2016-07-14 NOTE — Transfer of Care (Signed)
Immediate Anesthesia Transfer of Care Note  Patient: Donna Casey  Procedure(s) Performed: Procedure(s): DILATATION & CURETTAGE/HYSTEROSCOPY WITH NOVASURE ABLATION (N/A)  Patient Location: PACU  Anesthesia Type:General  Level of Consciousness: awake, alert , oriented and patient cooperative  Airway & Oxygen Therapy: Patient Spontanous Breathing and Patient connected to nasal cannula oxygen  Post-op Assessment: Report given to RN and Post -op Vital signs reviewed and stable  Post vital signs: Reviewed and stable  Last Vitals:  Vitals:   07/14/16 1106  BP: (!) 122/94  Pulse: 95  Resp: 20  Temp: 36.8 C    Last Pain:  Vitals:   07/14/16 1106  TempSrc: Oral  PainSc: 3       Patients Stated Pain Goal: 5 (07/14/16 1106)  Complications: No apparent anesthesia complications

## 2016-07-15 ENCOUNTER — Encounter (HOSPITAL_COMMUNITY): Payer: Self-pay | Admitting: Obstetrics and Gynecology

## 2016-10-06 DIAGNOSIS — K219 Gastro-esophageal reflux disease without esophagitis: Secondary | ICD-10-CM | POA: Diagnosis not present

## 2016-10-06 DIAGNOSIS — R739 Hyperglycemia, unspecified: Secondary | ICD-10-CM | POA: Diagnosis not present

## 2016-10-06 DIAGNOSIS — I10 Essential (primary) hypertension: Secondary | ICD-10-CM | POA: Diagnosis not present

## 2016-10-09 DIAGNOSIS — I10 Essential (primary) hypertension: Secondary | ICD-10-CM | POA: Diagnosis not present

## 2016-10-09 DIAGNOSIS — R7301 Impaired fasting glucose: Secondary | ICD-10-CM | POA: Diagnosis not present

## 2016-10-09 DIAGNOSIS — M542 Cervicalgia: Secondary | ICD-10-CM | POA: Diagnosis not present

## 2016-10-09 DIAGNOSIS — R1011 Right upper quadrant pain: Secondary | ICD-10-CM | POA: Diagnosis not present

## 2016-10-14 DIAGNOSIS — R1011 Right upper quadrant pain: Secondary | ICD-10-CM | POA: Diagnosis not present

## 2016-10-14 DIAGNOSIS — K760 Fatty (change of) liver, not elsewhere classified: Secondary | ICD-10-CM | POA: Diagnosis not present

## 2016-10-20 DIAGNOSIS — R112 Nausea with vomiting, unspecified: Secondary | ICD-10-CM | POA: Diagnosis not present

## 2016-10-20 DIAGNOSIS — R1011 Right upper quadrant pain: Secondary | ICD-10-CM | POA: Diagnosis not present

## 2016-10-28 DIAGNOSIS — M79601 Pain in right arm: Secondary | ICD-10-CM | POA: Diagnosis not present

## 2016-11-03 DIAGNOSIS — M79601 Pain in right arm: Secondary | ICD-10-CM | POA: Diagnosis not present

## 2016-11-03 DIAGNOSIS — M50221 Other cervical disc displacement at C4-C5 level: Secondary | ICD-10-CM | POA: Diagnosis not present

## 2016-11-03 DIAGNOSIS — M542 Cervicalgia: Secondary | ICD-10-CM | POA: Diagnosis not present

## 2016-12-02 DIAGNOSIS — M4722 Other spondylosis with radiculopathy, cervical region: Secondary | ICD-10-CM | POA: Diagnosis not present

## 2016-12-02 DIAGNOSIS — M542 Cervicalgia: Secondary | ICD-10-CM | POA: Diagnosis not present

## 2016-12-02 DIAGNOSIS — I10 Essential (primary) hypertension: Secondary | ICD-10-CM | POA: Diagnosis not present

## 2016-12-02 DIAGNOSIS — Z6837 Body mass index (BMI) 37.0-37.9, adult: Secondary | ICD-10-CM | POA: Diagnosis not present

## 2016-12-08 DIAGNOSIS — M4722 Other spondylosis with radiculopathy, cervical region: Secondary | ICD-10-CM | POA: Diagnosis not present

## 2016-12-31 DIAGNOSIS — J069 Acute upper respiratory infection, unspecified: Secondary | ICD-10-CM | POA: Diagnosis not present

## 2016-12-31 DIAGNOSIS — Z6838 Body mass index (BMI) 38.0-38.9, adult: Secondary | ICD-10-CM | POA: Diagnosis not present

## 2016-12-31 DIAGNOSIS — H81312 Aural vertigo, left ear: Secondary | ICD-10-CM | POA: Diagnosis not present

## 2017-01-05 DIAGNOSIS — M25612 Stiffness of left shoulder, not elsewhere classified: Secondary | ICD-10-CM | POA: Diagnosis not present

## 2017-01-05 DIAGNOSIS — M542 Cervicalgia: Secondary | ICD-10-CM | POA: Diagnosis not present

## 2017-01-05 DIAGNOSIS — M25611 Stiffness of right shoulder, not elsewhere classified: Secondary | ICD-10-CM | POA: Diagnosis not present

## 2017-01-05 DIAGNOSIS — M4722 Other spondylosis with radiculopathy, cervical region: Secondary | ICD-10-CM | POA: Diagnosis not present

## 2017-01-06 DIAGNOSIS — M4722 Other spondylosis with radiculopathy, cervical region: Secondary | ICD-10-CM | POA: Diagnosis not present

## 2017-01-06 DIAGNOSIS — M25611 Stiffness of right shoulder, not elsewhere classified: Secondary | ICD-10-CM | POA: Diagnosis not present

## 2017-01-06 DIAGNOSIS — M542 Cervicalgia: Secondary | ICD-10-CM | POA: Diagnosis not present

## 2017-01-06 DIAGNOSIS — M25612 Stiffness of left shoulder, not elsewhere classified: Secondary | ICD-10-CM | POA: Diagnosis not present

## 2017-01-08 DIAGNOSIS — M542 Cervicalgia: Secondary | ICD-10-CM | POA: Diagnosis not present

## 2017-01-08 DIAGNOSIS — M4722 Other spondylosis with radiculopathy, cervical region: Secondary | ICD-10-CM | POA: Diagnosis not present

## 2017-01-08 DIAGNOSIS — M25611 Stiffness of right shoulder, not elsewhere classified: Secondary | ICD-10-CM | POA: Diagnosis not present

## 2017-01-08 DIAGNOSIS — M25612 Stiffness of left shoulder, not elsewhere classified: Secondary | ICD-10-CM | POA: Diagnosis not present

## 2017-01-11 DIAGNOSIS — M25612 Stiffness of left shoulder, not elsewhere classified: Secondary | ICD-10-CM | POA: Diagnosis not present

## 2017-01-11 DIAGNOSIS — M4722 Other spondylosis with radiculopathy, cervical region: Secondary | ICD-10-CM | POA: Diagnosis not present

## 2017-01-11 DIAGNOSIS — M25611 Stiffness of right shoulder, not elsewhere classified: Secondary | ICD-10-CM | POA: Diagnosis not present

## 2017-01-11 DIAGNOSIS — M542 Cervicalgia: Secondary | ICD-10-CM | POA: Diagnosis not present

## 2017-01-12 DIAGNOSIS — M25611 Stiffness of right shoulder, not elsewhere classified: Secondary | ICD-10-CM | POA: Diagnosis not present

## 2017-01-12 DIAGNOSIS — M542 Cervicalgia: Secondary | ICD-10-CM | POA: Diagnosis not present

## 2017-01-12 DIAGNOSIS — M25612 Stiffness of left shoulder, not elsewhere classified: Secondary | ICD-10-CM | POA: Diagnosis not present

## 2017-01-12 DIAGNOSIS — M4722 Other spondylosis with radiculopathy, cervical region: Secondary | ICD-10-CM | POA: Diagnosis not present

## 2017-01-14 DIAGNOSIS — M542 Cervicalgia: Secondary | ICD-10-CM | POA: Diagnosis not present

## 2017-01-14 DIAGNOSIS — M25611 Stiffness of right shoulder, not elsewhere classified: Secondary | ICD-10-CM | POA: Diagnosis not present

## 2017-01-14 DIAGNOSIS — M4722 Other spondylosis with radiculopathy, cervical region: Secondary | ICD-10-CM | POA: Diagnosis not present

## 2017-01-14 DIAGNOSIS — M25612 Stiffness of left shoulder, not elsewhere classified: Secondary | ICD-10-CM | POA: Diagnosis not present

## 2017-01-19 DIAGNOSIS — M25612 Stiffness of left shoulder, not elsewhere classified: Secondary | ICD-10-CM | POA: Diagnosis not present

## 2017-01-19 DIAGNOSIS — M25611 Stiffness of right shoulder, not elsewhere classified: Secondary | ICD-10-CM | POA: Diagnosis not present

## 2017-01-19 DIAGNOSIS — M542 Cervicalgia: Secondary | ICD-10-CM | POA: Diagnosis not present

## 2017-01-19 DIAGNOSIS — M4722 Other spondylosis with radiculopathy, cervical region: Secondary | ICD-10-CM | POA: Diagnosis not present

## 2017-01-20 DIAGNOSIS — M4722 Other spondylosis with radiculopathy, cervical region: Secondary | ICD-10-CM | POA: Diagnosis not present

## 2017-01-20 DIAGNOSIS — M542 Cervicalgia: Secondary | ICD-10-CM | POA: Diagnosis not present

## 2017-01-20 DIAGNOSIS — M25611 Stiffness of right shoulder, not elsewhere classified: Secondary | ICD-10-CM | POA: Diagnosis not present

## 2017-01-20 DIAGNOSIS — M25612 Stiffness of left shoulder, not elsewhere classified: Secondary | ICD-10-CM | POA: Diagnosis not present

## 2017-01-22 DIAGNOSIS — M25611 Stiffness of right shoulder, not elsewhere classified: Secondary | ICD-10-CM | POA: Diagnosis not present

## 2017-01-22 DIAGNOSIS — M25612 Stiffness of left shoulder, not elsewhere classified: Secondary | ICD-10-CM | POA: Diagnosis not present

## 2017-01-22 DIAGNOSIS — M4722 Other spondylosis with radiculopathy, cervical region: Secondary | ICD-10-CM | POA: Diagnosis not present

## 2017-01-22 DIAGNOSIS — M542 Cervicalgia: Secondary | ICD-10-CM | POA: Diagnosis not present

## 2017-01-25 DIAGNOSIS — M25612 Stiffness of left shoulder, not elsewhere classified: Secondary | ICD-10-CM | POA: Diagnosis not present

## 2017-01-25 DIAGNOSIS — M25611 Stiffness of right shoulder, not elsewhere classified: Secondary | ICD-10-CM | POA: Diagnosis not present

## 2017-01-25 DIAGNOSIS — M4722 Other spondylosis with radiculopathy, cervical region: Secondary | ICD-10-CM | POA: Diagnosis not present

## 2017-01-25 DIAGNOSIS — M542 Cervicalgia: Secondary | ICD-10-CM | POA: Diagnosis not present

## 2017-01-26 DIAGNOSIS — M4722 Other spondylosis with radiculopathy, cervical region: Secondary | ICD-10-CM | POA: Diagnosis not present

## 2017-01-26 DIAGNOSIS — M25611 Stiffness of right shoulder, not elsewhere classified: Secondary | ICD-10-CM | POA: Diagnosis not present

## 2017-01-26 DIAGNOSIS — M542 Cervicalgia: Secondary | ICD-10-CM | POA: Diagnosis not present

## 2017-01-26 DIAGNOSIS — M25612 Stiffness of left shoulder, not elsewhere classified: Secondary | ICD-10-CM | POA: Diagnosis not present

## 2017-01-28 DIAGNOSIS — M542 Cervicalgia: Secondary | ICD-10-CM | POA: Diagnosis not present

## 2017-01-28 DIAGNOSIS — M4722 Other spondylosis with radiculopathy, cervical region: Secondary | ICD-10-CM | POA: Diagnosis not present

## 2017-01-28 DIAGNOSIS — M25611 Stiffness of right shoulder, not elsewhere classified: Secondary | ICD-10-CM | POA: Diagnosis not present

## 2017-01-28 DIAGNOSIS — M25612 Stiffness of left shoulder, not elsewhere classified: Secondary | ICD-10-CM | POA: Diagnosis not present

## 2017-02-01 DIAGNOSIS — M542 Cervicalgia: Secondary | ICD-10-CM | POA: Diagnosis not present

## 2017-02-01 DIAGNOSIS — M25611 Stiffness of right shoulder, not elsewhere classified: Secondary | ICD-10-CM | POA: Diagnosis not present

## 2017-02-01 DIAGNOSIS — M25612 Stiffness of left shoulder, not elsewhere classified: Secondary | ICD-10-CM | POA: Diagnosis not present

## 2017-02-01 DIAGNOSIS — M4722 Other spondylosis with radiculopathy, cervical region: Secondary | ICD-10-CM | POA: Diagnosis not present

## 2017-02-04 DIAGNOSIS — Z6839 Body mass index (BMI) 39.0-39.9, adult: Secondary | ICD-10-CM | POA: Diagnosis not present

## 2017-02-04 DIAGNOSIS — R5383 Other fatigue: Secondary | ICD-10-CM | POA: Diagnosis not present

## 2017-02-04 DIAGNOSIS — M79609 Pain in unspecified limb: Secondary | ICD-10-CM | POA: Diagnosis not present

## 2017-02-04 DIAGNOSIS — M25612 Stiffness of left shoulder, not elsewhere classified: Secondary | ICD-10-CM | POA: Diagnosis not present

## 2017-02-04 DIAGNOSIS — H8110 Benign paroxysmal vertigo, unspecified ear: Secondary | ICD-10-CM | POA: Diagnosis not present

## 2017-02-04 DIAGNOSIS — M4722 Other spondylosis with radiculopathy, cervical region: Secondary | ICD-10-CM | POA: Diagnosis not present

## 2017-02-04 DIAGNOSIS — M542 Cervicalgia: Secondary | ICD-10-CM | POA: Diagnosis not present

## 2017-02-04 DIAGNOSIS — M25611 Stiffness of right shoulder, not elsewhere classified: Secondary | ICD-10-CM | POA: Diagnosis not present

## 2017-02-09 DIAGNOSIS — M25612 Stiffness of left shoulder, not elsewhere classified: Secondary | ICD-10-CM | POA: Diagnosis not present

## 2017-02-09 DIAGNOSIS — M4722 Other spondylosis with radiculopathy, cervical region: Secondary | ICD-10-CM | POA: Diagnosis not present

## 2017-02-09 DIAGNOSIS — M25611 Stiffness of right shoulder, not elsewhere classified: Secondary | ICD-10-CM | POA: Diagnosis not present

## 2017-02-09 DIAGNOSIS — M542 Cervicalgia: Secondary | ICD-10-CM | POA: Diagnosis not present

## 2017-02-11 DIAGNOSIS — M25611 Stiffness of right shoulder, not elsewhere classified: Secondary | ICD-10-CM | POA: Diagnosis not present

## 2017-02-11 DIAGNOSIS — M542 Cervicalgia: Secondary | ICD-10-CM | POA: Diagnosis not present

## 2017-02-11 DIAGNOSIS — M4722 Other spondylosis with radiculopathy, cervical region: Secondary | ICD-10-CM | POA: Diagnosis not present

## 2017-02-11 DIAGNOSIS — M25612 Stiffness of left shoulder, not elsewhere classified: Secondary | ICD-10-CM | POA: Diagnosis not present

## 2017-02-15 DIAGNOSIS — M25611 Stiffness of right shoulder, not elsewhere classified: Secondary | ICD-10-CM | POA: Diagnosis not present

## 2017-02-15 DIAGNOSIS — M4722 Other spondylosis with radiculopathy, cervical region: Secondary | ICD-10-CM | POA: Diagnosis not present

## 2017-02-15 DIAGNOSIS — M542 Cervicalgia: Secondary | ICD-10-CM | POA: Diagnosis not present

## 2017-02-15 DIAGNOSIS — M25612 Stiffness of left shoulder, not elsewhere classified: Secondary | ICD-10-CM | POA: Diagnosis not present

## 2017-02-17 ENCOUNTER — Ambulatory Visit: Payer: BLUE CROSS/BLUE SHIELD | Admitting: Neurology

## 2017-02-18 DIAGNOSIS — M25611 Stiffness of right shoulder, not elsewhere classified: Secondary | ICD-10-CM | POA: Diagnosis not present

## 2017-02-18 DIAGNOSIS — M542 Cervicalgia: Secondary | ICD-10-CM | POA: Diagnosis not present

## 2017-02-18 DIAGNOSIS — M4722 Other spondylosis with radiculopathy, cervical region: Secondary | ICD-10-CM | POA: Diagnosis not present

## 2017-02-18 DIAGNOSIS — M25612 Stiffness of left shoulder, not elsewhere classified: Secondary | ICD-10-CM | POA: Diagnosis not present

## 2017-02-23 DIAGNOSIS — M25611 Stiffness of right shoulder, not elsewhere classified: Secondary | ICD-10-CM | POA: Diagnosis not present

## 2017-02-23 DIAGNOSIS — M25612 Stiffness of left shoulder, not elsewhere classified: Secondary | ICD-10-CM | POA: Diagnosis not present

## 2017-02-23 DIAGNOSIS — M542 Cervicalgia: Secondary | ICD-10-CM | POA: Diagnosis not present

## 2017-02-23 DIAGNOSIS — M4722 Other spondylosis with radiculopathy, cervical region: Secondary | ICD-10-CM | POA: Diagnosis not present

## 2017-02-24 DIAGNOSIS — M4722 Other spondylosis with radiculopathy, cervical region: Secondary | ICD-10-CM | POA: Diagnosis not present

## 2017-02-25 ENCOUNTER — Encounter: Payer: Self-pay | Admitting: Neurology

## 2017-02-26 DIAGNOSIS — M542 Cervicalgia: Secondary | ICD-10-CM | POA: Diagnosis not present

## 2017-02-26 DIAGNOSIS — M25612 Stiffness of left shoulder, not elsewhere classified: Secondary | ICD-10-CM | POA: Diagnosis not present

## 2017-02-26 DIAGNOSIS — M4722 Other spondylosis with radiculopathy, cervical region: Secondary | ICD-10-CM | POA: Diagnosis not present

## 2017-02-26 DIAGNOSIS — M25611 Stiffness of right shoulder, not elsewhere classified: Secondary | ICD-10-CM | POA: Diagnosis not present

## 2017-03-03 DIAGNOSIS — M25611 Stiffness of right shoulder, not elsewhere classified: Secondary | ICD-10-CM | POA: Diagnosis not present

## 2017-03-03 DIAGNOSIS — M4722 Other spondylosis with radiculopathy, cervical region: Secondary | ICD-10-CM | POA: Diagnosis not present

## 2017-03-03 DIAGNOSIS — M25612 Stiffness of left shoulder, not elsewhere classified: Secondary | ICD-10-CM | POA: Diagnosis not present

## 2017-03-03 DIAGNOSIS — M542 Cervicalgia: Secondary | ICD-10-CM | POA: Diagnosis not present

## 2017-03-04 DIAGNOSIS — M4722 Other spondylosis with radiculopathy, cervical region: Secondary | ICD-10-CM | POA: Diagnosis not present

## 2017-03-04 DIAGNOSIS — M25612 Stiffness of left shoulder, not elsewhere classified: Secondary | ICD-10-CM | POA: Diagnosis not present

## 2017-03-04 DIAGNOSIS — M542 Cervicalgia: Secondary | ICD-10-CM | POA: Diagnosis not present

## 2017-03-04 DIAGNOSIS — M25611 Stiffness of right shoulder, not elsewhere classified: Secondary | ICD-10-CM | POA: Diagnosis not present

## 2017-03-10 DIAGNOSIS — M25611 Stiffness of right shoulder, not elsewhere classified: Secondary | ICD-10-CM | POA: Diagnosis not present

## 2017-03-10 DIAGNOSIS — M25612 Stiffness of left shoulder, not elsewhere classified: Secondary | ICD-10-CM | POA: Diagnosis not present

## 2017-03-10 DIAGNOSIS — M4722 Other spondylosis with radiculopathy, cervical region: Secondary | ICD-10-CM | POA: Diagnosis not present

## 2017-03-10 DIAGNOSIS — M542 Cervicalgia: Secondary | ICD-10-CM | POA: Diagnosis not present

## 2017-03-11 ENCOUNTER — Ambulatory Visit (INDEPENDENT_AMBULATORY_CARE_PROVIDER_SITE_OTHER): Payer: BLUE CROSS/BLUE SHIELD | Admitting: Neurology

## 2017-03-11 ENCOUNTER — Encounter (INDEPENDENT_AMBULATORY_CARE_PROVIDER_SITE_OTHER): Payer: Self-pay

## 2017-03-11 ENCOUNTER — Encounter: Payer: Self-pay | Admitting: Neurology

## 2017-03-11 VITALS — BP 141/97 | HR 104 | Resp 20 | Ht 69.0 in | Wt 276.0 lb

## 2017-03-11 DIAGNOSIS — M5416 Radiculopathy, lumbar region: Secondary | ICD-10-CM

## 2017-03-11 DIAGNOSIS — R202 Paresthesia of skin: Secondary | ICD-10-CM | POA: Diagnosis not present

## 2017-03-11 DIAGNOSIS — R29898 Other symptoms and signs involving the musculoskeletal system: Secondary | ICD-10-CM

## 2017-03-11 NOTE — Patient Instructions (Addendum)
Remember to drink plenty of fluid, eat healthy meals and do not skip any meals. Try to eat protein with a every meal and eat a healthy snack such as fruit or nuts in between meals. Try to keep a regular sleep-wake schedule and try to exercise daily, particularly in the form of walking, 20-30 minutes a day, if you can.   As far as your medications are concerned, I would like to suggest: Take 4 pills gabapentin at night  As far as diagnostic testing: Labs, EMG/NCS, MRI lumbar spine  I would like to see you back for emg/ncs, sooner if we need to. Please call us with any interim questions, concerns, problems, updates or refill requests.   Our phone number is [phone]. We also have an after hours call service for urgent matters and there is a physician on-call for urgent questions. For any emergencies you know to call 911 or go to the nearest emergency room

## 2017-03-11 NOTE — Progress Notes (Signed)
[facility]    Provider:  Dr Jaynee Eagles Referring Provider: Manon Hilding, MD Primary Care Physician:  Manon Hilding, MD  CC:   tingling in feet   HPI:  Donna Casey is a 56 y.o. female here as a referral from Dr. Quintin Alto for paresthesias. PMHx of HTN. She had surgery on her neck in January, fused C3-4-5-6 fused. She was having numbness in her right arm and neck pain and stiffness and that is why the surgery was performed. She has been going through physical therapy and has since noticed her legs feeling tingling and restless on the bottom of her feet. More on the right side. Tingling, continuously, it is very uncomfortable and she has to get up and move around. She saw Dr. Cyndy Freeze again, her surgeon, who did not think it had anything to do with her neck. She has gained weight about 25 pounds. She took meclizine but otherwise no new medications. She also had back surgery in 1991 on her lumbar spine. Symptoms started 2 weeks after her surgery. Worse at night and wakes her up. Better with movement. Her legs feel restless. The tingling starts on the lateral side of the lower lag and radiates to the whole foot. She has chronic weakness on the right leg weakness form the 1991 surgery but now she feels weaker in the legs. Balance is not as good. It travels down the back and lateral calf to the bottom of the feet, not on the top of the feet. No other focal neurologic deficits, associated symptoms, inciting events or modifiable factors.  Reviewed notes, labs and imaging from outside physicians, which showed:  Reviewed labs. CBC with mild anemia, BMP unremarkable 06/2016 Review of Systems: Patient complains of symptoms per HPI as well as the following symptoms: weight gain, blurred vision. Pertinent negatives per HPI. All others negative.   Social History   Social History  . Marital status: Married    Spouse name: N/A  . Number of children: N/A  . Years of education: N/A    Occupational History  . Not on file.   Social History Main Topics  . Smoking status: Never Smoker  . Smokeless tobacco: Never Used  . Alcohol use No  . Drug use: No  . Sexual activity: Not on file   Other Topics Concern  . Not on file   Social History Narrative  . No narrative on file    Family History  Problem Relation Age of Onset  . Neuropathy Neg Hx     Past Medical History:  Diagnosis Date  . Anemia   . Enlarged liver   . GERD (gastroesophageal reflux disease)   . H pylori ulcer   . Heart palpitations   . Hypertension   . Numbness and tingling in hands    right hand  . PONV (postoperative nausea and vomiting)     Past Surgical History:  Procedure Laterality Date  . BACK SURGERY     disk removal  . CERVICAL FUSION    . COLONOSCOPY    . [place] N/A 07/14/2016   Procedure: DILATATION & CURETTAGE/HYSTEROSCOPY WITH NOVASURE ABLATION;  Surgeon: Olga Millers, MD;  Location: [facility];  Service: Gynecology;  Laterality: N/A;  . MOUTH SURGERY    . TOTAL HIP ARTHROPLASTY Right   . TUBAL LIGATION      Current Outpatient Prescriptions  Medication Sig Dispense Refill  . amLODipine (NORVASC) 5 MG tablet Take 5 mg by mouth  daily.  12  . diclofenac (VOLTAREN) 75 MG EC tablet Take 75 mg by mouth 2 (two) times daily.  0  . ferrous sulfate 325 (65 FE) MG tablet Take 325 mg by mouth daily with breakfast.    . gabapentin (NEURONTIN) 300 MG capsule Take 900 mg by mouth 3 (three) times daily.  3  . hydrochlorothiazide (HYDRODIURIL) 25 MG tablet Take 25 mg by mouth daily.  0  . methocarbamol (ROBAXIN) 750 MG tablet Take 750 mg by mouth every 8 (eight) hours.  2  . omeprazole (PRILOSEC) 20 MG capsule Take 20 mg by mouth daily.  4   No current facility-administered medications for this visit.     Allergies as of 03/11/2017 - Review Complete 03/11/2017  Allergen Reaction Noted  . Oxycodone Nausea And Vomiting 06/30/2016     Vitals: BP (!) 141/97   Pulse (!) 104   Resp 20   Ht 5' 9"  (1.753 m)   Wt 276 lb (125.2 kg)   BMI 40.76 kg/m  Last Weight:  Wt Readings from Last 1 Encounters:  03/11/17 276 lb (125.2 kg)   Last Height:   Ht Readings from Last 1 Encounters:  03/11/17 5' 9"  (1.753 m)    Physical exam: Exam: Gen: NAD, conversant, well nourised, obese, well groomed                     CV: RRR, no MRG. No Carotid Bruits. No peripheral edema, warm, nontender Eyes: Conjunctivae clear without exudates or hemorrhage  Neuro: Detailed Neurologic Exam  Speech:    Speech is normal; fluent and spontaneous with normal comprehension.  Cognition:    The patient is oriented to person, place, and time;     recent and remote memory intact;     language fluent;     normal attention, concentration,     fund of knowledge Cranial Nerves:    The pupils are equal, round, and reactive to light. The fundi are normal and spontaneous venous pulsations are present. Visual fields are full to finger confrontation. Extraocular movements are intact. Trigeminal sensation is intact and the muscles of mastication are normal. The face is symmetric. The palate elevates in the midline. Hearing intact. Voice is normal. Shoulder shrug is normal. The tongue has normal motion without fasciculations.   Coordination:    Normal finger to nose and heel to shin. Normal rapid alternating movements.   Gait:    Heel-toe and tandem gait are normal.   Motor Observation:    No asymmetry, no atrophy, and no involuntary movements noted. Tone:    Normal muscle tone.    Posture:    Posture is normal. normal erect    Strength:  Right delt 4/5, left 4+/5 5- right hip and leg flexion Otherwise strength is V/V in the upper and lower limbs.      Sensation: decreased distally in the LE in a gradient fashion to pin prick and temp. Intact vibration and proprioception.      Reflex Exam:  DTR's:    Deep tendon reflexes in the lower  extremities are hyporeflexic bilaterally.   Toes:    The toes are downgoing bilaterally.   Clonus:    Clonus is absent.      Assessment/Plan:  56 year old female with paresthesias, numbness and weakness of the right leg in an L5/S1 distribution.  MRI lumbar spine to evaluate for lumbar radiculopathy for surgical eval or epidural steroid injections due to weakness of the right  leg and asymmetric paresthesias emg/ncs lower extremities Labwork today  Orders Placed This Encounter  Procedures  . MR LUMBAR SPINE WO CONTRAST  . Angiotensin converting enzyme  . TSH  . Sedimentation rate  . ANA w/Reflex  . Sjogren's syndrome antibods(ssa + ssb)  . B12 and Folate Panel  . Rheumatoid factor  . Heavy metals, blood  . Vitamin B6  . Multiple Myeloma Panel (SPEP&IFE w/QIG)  . Vitamin B1  . B. burgdorfi Antibody  . Hemoglobin A1c  . Methylmalonic acid, serum  . CBC  . Comprehensive metabolic panel  . NCV with EMG(electromyography)   Cc: Dr. Wray Kearns, [facility] [address] [place], [place] [address]  Phone [phone] Fax [phone]

## 2017-03-12 DIAGNOSIS — M4722 Other spondylosis with radiculopathy, cervical region: Secondary | ICD-10-CM | POA: Diagnosis not present

## 2017-03-12 DIAGNOSIS — M542 Cervicalgia: Secondary | ICD-10-CM | POA: Diagnosis not present

## 2017-03-12 DIAGNOSIS — M25612 Stiffness of left shoulder, not elsewhere classified: Secondary | ICD-10-CM | POA: Diagnosis not present

## 2017-03-12 DIAGNOSIS — M25611 Stiffness of right shoulder, not elsewhere classified: Secondary | ICD-10-CM | POA: Diagnosis not present

## 2017-03-14 ENCOUNTER — Encounter: Payer: Self-pay | Admitting: Neurology

## 2017-03-14 DIAGNOSIS — M5416 Radiculopathy, lumbar region: Secondary | ICD-10-CM | POA: Insufficient documentation

## 2017-03-15 LAB — MULTIPLE MYELOMA PANEL, SERUM
ALBUMIN SERPL ELPH-MCNC: 4 g/dL (ref 2.9–4.4)
Albumin/Glob SerPl: 1.3 (ref 0.7–1.7)
Alpha 1: 0.2 g/dL (ref 0.0–0.4)
Alpha2 Glob SerPl Elph-Mcnc: 0.6 g/dL (ref 0.4–1.0)
B-Globulin SerPl Elph-Mcnc: 1 g/dL (ref 0.7–1.3)
GAMMA GLOB SERPL ELPH-MCNC: 1.4 g/dL (ref 0.4–1.8)
Globulin, Total: 3.2 g/dL (ref 2.2–3.9)
IGM (IMMUNOGLOBULIN M), SRM: 59 mg/dL (ref 26–217)
IgA/Immunoglobulin A, Serum: 207 mg/dL (ref 87–352)
IgG (Immunoglobin G), Serum: 1312 mg/dL (ref 700–1600)

## 2017-03-15 LAB — TSH: TSH: 2.79 u[IU]/mL (ref 0.450–4.500)

## 2017-03-15 LAB — COMPREHENSIVE METABOLIC PANEL
A/G RATIO: 1.7 (ref 1.2–2.2)
ALK PHOS: 59 IU/L (ref 39–117)
ALT: 48 IU/L — ABNORMAL HIGH (ref 0–32)
AST: 38 IU/L (ref 0–40)
Albumin: 4.5 g/dL (ref 3.5–5.5)
BUN / CREAT RATIO: 19 (ref 9–23)
BUN: 16 mg/dL (ref 6–24)
Bilirubin Total: 0.3 mg/dL (ref 0.0–1.2)
CO2: 27 mmol/L (ref 18–29)
Calcium: 9.2 mg/dL (ref 8.7–10.2)
Chloride: 100 mmol/L (ref 96–106)
Creatinine, Ser: 0.86 mg/dL (ref 0.57–1.00)
GFR calc Af Amer: 89 mL/min/{1.73_m2} (ref >59)
GFR calc non Af Amer: 77 mL/min/{1.73_m2} (ref >59)
GLOBULIN, TOTAL: 2.7 g/dL (ref 1.5–4.5)
Glucose: 107 mg/dL — ABNORMAL HIGH (ref 65–99)
POTASSIUM: 4.3 mmol/L (ref 3.5–5.2)
SODIUM: 141 mmol/L (ref 134–144)
Total Protein: 7.2 g/dL (ref 6.0–8.5)

## 2017-03-15 LAB — B12 AND FOLATE PANEL
Folate: 11.8 ng/mL (ref >3.0)
VITAMIN B 12: 616 pg/mL (ref 232–1245)

## 2017-03-15 LAB — HEAVY METALS, BLOOD
Arsenic: 3 ug/L (ref 2–23)
LEAD, BLOOD: NOT DETECTED ug/dL (ref 0–19)
Mercury: NOT DETECTED ug/L (ref 0.0–14.9)

## 2017-03-15 LAB — METHYLMALONIC ACID, SERUM: METHYLMALONIC ACID: 167 nmol/L (ref 0–378)

## 2017-03-15 LAB — CBC
Hematocrit: 37.6 % (ref 34.0–46.6)
Hemoglobin: 12.4 g/dL (ref 11.1–15.9)
MCH: 28.6 pg (ref 26.6–33.0)
MCHC: 33 g/dL (ref 31.5–35.7)
MCV: 87 fL (ref 79–97)
Platelets: 393 10*3/uL — ABNORMAL HIGH (ref 150–379)
RBC: 4.34 x10E6/uL (ref 3.77–5.28)
RDW: 14.4 % (ref 12.3–15.4)
WBC: 5.2 10*3/uL (ref 3.4–10.8)

## 2017-03-15 LAB — ANA W/REFLEX: Anti Nuclear Antibody(ANA): NEGATIVE

## 2017-03-15 LAB — VITAMIN B1: THIAMINE: 110.3 nmol/L (ref 66.5–200.0)

## 2017-03-15 LAB — SJOGREN'S SYNDROME ANTIBODS(SSA + SSB): ENA SSA (RO) Ab: <0.2 AI (ref 0.0–0.9)

## 2017-03-15 LAB — B. BURGDORFI ANTIBODIES

## 2017-03-15 LAB — RHEUMATOID FACTOR

## 2017-03-15 LAB — ANGIOTENSIN CONVERTING ENZYME: Angio Convert Enzyme: 37 U/L (ref 14–82)

## 2017-03-15 LAB — SEDIMENTATION RATE: Sed Rate: 2 mm/hr (ref 0–40)

## 2017-03-15 LAB — VITAMIN B6: Vitamin B6: 13.4 ug/L (ref 2.0–32.8)

## 2017-03-15 LAB — HEMOGLOBIN A1C
Est. average glucose Bld gHb Est-mCnc: 131 mg/dL
HEMOGLOBIN A1C: 6.2 % — AB (ref 4.8–5.6)

## 2017-03-16 ENCOUNTER — Telehealth: Payer: Self-pay

## 2017-03-16 NOTE — Telephone Encounter (Signed)
-----   Message from Anson Fret, MD sent at 03/14/2017  7:55 PM EDT ----- Her hgba1c is 6.2 which is almost diabetic range. Even pre-diabetes can cause paresthesias in the feet and I recommend she follow up with pcp and see if he suggests starting treatment; even though she only has pre-diabetes if she is developing diabetic neuropathy then she may need to be treated even though it hasn;t reached diabetic levels. But we still have further testing pending to see if we can find any other reason thanks

## 2017-03-16 NOTE — Telephone Encounter (Signed)
Called pt w/ lab results and recommendations to follow-up w/ PCP for elevated A1C. Questions answered re: diabetic neuropathy (symptoms, management, etc). Verbalized understanding. Plans on keeping appt for MRI next week and NCV/EMG next month. Voiced appreciation for call.

## 2017-03-17 DIAGNOSIS — M25612 Stiffness of left shoulder, not elsewhere classified: Secondary | ICD-10-CM | POA: Diagnosis not present

## 2017-03-17 DIAGNOSIS — M542 Cervicalgia: Secondary | ICD-10-CM | POA: Diagnosis not present

## 2017-03-17 DIAGNOSIS — M4722 Other spondylosis with radiculopathy, cervical region: Secondary | ICD-10-CM | POA: Diagnosis not present

## 2017-03-17 DIAGNOSIS — M25611 Stiffness of right shoulder, not elsewhere classified: Secondary | ICD-10-CM | POA: Diagnosis not present

## 2017-03-19 DIAGNOSIS — M25612 Stiffness of left shoulder, not elsewhere classified: Secondary | ICD-10-CM | POA: Diagnosis not present

## 2017-03-19 DIAGNOSIS — M25611 Stiffness of right shoulder, not elsewhere classified: Secondary | ICD-10-CM | POA: Diagnosis not present

## 2017-03-19 DIAGNOSIS — M4722 Other spondylosis with radiculopathy, cervical region: Secondary | ICD-10-CM | POA: Diagnosis not present

## 2017-03-19 DIAGNOSIS — M542 Cervicalgia: Secondary | ICD-10-CM | POA: Diagnosis not present

## 2017-03-24 ENCOUNTER — Ambulatory Visit (INDEPENDENT_AMBULATORY_CARE_PROVIDER_SITE_OTHER): Payer: BLUE CROSS/BLUE SHIELD

## 2017-03-24 DIAGNOSIS — R202 Paresthesia of skin: Secondary | ICD-10-CM

## 2017-03-24 DIAGNOSIS — R29898 Other symptoms and signs involving the musculoskeletal system: Secondary | ICD-10-CM | POA: Diagnosis not present

## 2017-03-24 DIAGNOSIS — M4722 Other spondylosis with radiculopathy, cervical region: Secondary | ICD-10-CM | POA: Diagnosis not present

## 2017-03-24 DIAGNOSIS — M5416 Radiculopathy, lumbar region: Secondary | ICD-10-CM

## 2017-03-24 DIAGNOSIS — Z6841 Body Mass Index (BMI) 40.0 and over, adult: Secondary | ICD-10-CM | POA: Diagnosis not present

## 2017-03-24 DIAGNOSIS — I10 Essential (primary) hypertension: Secondary | ICD-10-CM | POA: Diagnosis not present

## 2017-03-25 DIAGNOSIS — H04123 Dry eye syndrome of bilateral lacrimal glands: Secondary | ICD-10-CM | POA: Diagnosis not present

## 2017-03-25 DIAGNOSIS — H524 Presbyopia: Secondary | ICD-10-CM | POA: Diagnosis not present

## 2017-03-25 DIAGNOSIS — H5201 Hypermetropia, right eye: Secondary | ICD-10-CM | POA: Diagnosis not present

## 2017-03-25 DIAGNOSIS — H52223 Regular astigmatism, bilateral: Secondary | ICD-10-CM | POA: Diagnosis not present

## 2017-03-31 ENCOUNTER — Telehealth: Payer: Self-pay | Admitting: Neurology

## 2017-03-31 ENCOUNTER — Telehealth: Payer: Self-pay

## 2017-03-31 DIAGNOSIS — R937 Abnormal findings on diagnostic imaging of other parts of musculoskeletal system: Secondary | ICD-10-CM

## 2017-03-31 DIAGNOSIS — R200 Anesthesia of skin: Secondary | ICD-10-CM

## 2017-03-31 DIAGNOSIS — R202 Paresthesia of skin: Secondary | ICD-10-CM

## 2017-03-31 DIAGNOSIS — R29898 Other symptoms and signs involving the musculoskeletal system: Secondary | ICD-10-CM

## 2017-03-31 NOTE — Telephone Encounter (Signed)
Pt request MRI results.  °

## 2017-03-31 NOTE — Telephone Encounter (Signed)
-----   Message from Anson Fret, MD sent at 03/29/2017  8:10 PM EDT ----- The MRI of the lumbar spine does show L5/S1 degenerative changes and narrowing where the nerves come out. It's possible she has L5/S1 radiculopathy as we suspected at her appointment. She can try epidural steroid injections but she should see a surgeon either an orthopaedist or a neurosurgeon first and they can set it up. She saw Dr. Bevely Palmer in the past not sure if he does lumbar surgery but she needs evaluation because given her symptoms and the MRI findings I do think she has nerve compression from the low back. I am happy to refer her wherever she likes, let me know thanks

## 2017-03-31 NOTE — Telephone Encounter (Signed)
Called pt w/ MRI results and recommendation for ESIs. Verbalized understanding and appreciation for call. Says that she is currently seeing Dr. Bevely Palmer, a neurosurgeon, s/p neck surgery in January and would like to continue being followed thereif possible for her lower back issues as well. Also asks if she needs to keep appt for NCV/EMG.

## 2017-04-01 NOTE — Addendum Note (Signed)
Addended by: Donnelly AngelicaHOGAN, Manuelito Poage L on: 04/01/2017 11:20 AM   Modules accepted: Orders

## 2017-04-01 NOTE — Telephone Encounter (Signed)
Returned call to pt who says that she's waiting on Dr. Bevely Palmeritty to also order MRI of her upper back. NCV/EMG cancelled for now, until after she consults w/ neurosurgeon. Voiced understanding and appreciation for call.

## 2017-04-01 NOTE — Telephone Encounter (Signed)
That's is fine please order referral to dr. Bevely Palmeritty and leyts wait and see whathe says before we do the emg/ncs thanks

## 2017-04-02 DIAGNOSIS — I10 Essential (primary) hypertension: Secondary | ICD-10-CM | POA: Diagnosis not present

## 2017-04-02 DIAGNOSIS — R5383 Other fatigue: Secondary | ICD-10-CM | POA: Diagnosis not present

## 2017-04-02 DIAGNOSIS — K210 Gastro-esophageal reflux disease with esophagitis: Secondary | ICD-10-CM | POA: Diagnosis not present

## 2017-04-02 DIAGNOSIS — R739 Hyperglycemia, unspecified: Secondary | ICD-10-CM | POA: Diagnosis not present

## 2017-04-06 ENCOUNTER — Other Ambulatory Visit: Payer: Self-pay | Admitting: Physician Assistant

## 2017-04-06 DIAGNOSIS — I10 Essential (primary) hypertension: Secondary | ICD-10-CM | POA: Diagnosis not present

## 2017-04-06 DIAGNOSIS — M542 Cervicalgia: Secondary | ICD-10-CM | POA: Diagnosis not present

## 2017-04-06 DIAGNOSIS — R1011 Right upper quadrant pain: Secondary | ICD-10-CM | POA: Diagnosis not present

## 2017-04-06 DIAGNOSIS — R7301 Impaired fasting glucose: Secondary | ICD-10-CM | POA: Diagnosis not present

## 2017-04-06 DIAGNOSIS — M4722 Other spondylosis with radiculopathy, cervical region: Secondary | ICD-10-CM

## 2017-04-14 ENCOUNTER — Other Ambulatory Visit: Payer: BLUE CROSS/BLUE SHIELD

## 2017-04-15 ENCOUNTER — Encounter: Payer: BLUE CROSS/BLUE SHIELD | Admitting: Neurology

## 2017-04-17 ENCOUNTER — Ambulatory Visit
Admission: RE | Admit: 2017-04-17 | Discharge: 2017-04-17 | Disposition: A | Discharge status: Home / Self Care | Payer: BLUE CROSS/BLUE SHIELD | Source: Ambulatory Visit | Attending: Physician Assistant | Admitting: Physician Assistant

## 2017-04-17 DIAGNOSIS — M4802 Spinal stenosis, cervical region: Secondary | ICD-10-CM | POA: Diagnosis not present

## 2017-04-17 DIAGNOSIS — M4722 Other spondylosis with radiculopathy, cervical region: Secondary | ICD-10-CM

## 2017-04-17 MED ORDER — GADOBENATE DIMEGLUMINE 529 MG/ML IV SOLN
20.0000 mL | Freq: Once | INTRAVENOUS | Status: AC | PRN
  Administered 2017-04-17: 20 mL via INTRAVENOUS

## 2017-04-28 DIAGNOSIS — Z6841 Body Mass Index (BMI) 40.0 and over, adult: Secondary | ICD-10-CM | POA: Diagnosis not present

## 2017-04-28 DIAGNOSIS — M542 Cervicalgia: Secondary | ICD-10-CM | POA: Diagnosis not present

## 2017-04-28 DIAGNOSIS — M4722 Other spondylosis with radiculopathy, cervical region: Secondary | ICD-10-CM | POA: Diagnosis not present

## 2017-04-28 DIAGNOSIS — M545 Low back pain: Secondary | ICD-10-CM | POA: Diagnosis not present

## 2017-05-03 DIAGNOSIS — M25511 Pain in right shoulder: Secondary | ICD-10-CM | POA: Diagnosis not present

## 2017-05-10 DIAGNOSIS — M25511 Pain in right shoulder: Secondary | ICD-10-CM | POA: Diagnosis not present

## 2017-05-12 DIAGNOSIS — M25511 Pain in right shoulder: Secondary | ICD-10-CM | POA: Diagnosis not present

## 2017-05-12 DIAGNOSIS — M9983 Other biomechanical lesions of lumbar region: Secondary | ICD-10-CM | POA: Diagnosis not present

## 2017-05-12 DIAGNOSIS — M79601 Pain in right arm: Secondary | ICD-10-CM | POA: Diagnosis not present

## 2017-05-12 DIAGNOSIS — M5416 Radiculopathy, lumbar region: Secondary | ICD-10-CM | POA: Diagnosis not present

## 2017-05-12 DIAGNOSIS — M25551 Pain in right hip: Secondary | ICD-10-CM | POA: Diagnosis not present

## 2017-05-12 DIAGNOSIS — M5412 Radiculopathy, cervical region: Secondary | ICD-10-CM | POA: Diagnosis not present

## 2017-05-19 DIAGNOSIS — I10 Essential (primary) hypertension: Secondary | ICD-10-CM | POA: Diagnosis not present

## 2017-05-19 DIAGNOSIS — M9983 Other biomechanical lesions of lumbar region: Secondary | ICD-10-CM | POA: Diagnosis not present

## 2017-05-19 DIAGNOSIS — M5416 Radiculopathy, lumbar region: Secondary | ICD-10-CM | POA: Diagnosis not present

## 2017-05-19 DIAGNOSIS — Z6841 Body Mass Index (BMI) 40.0 and over, adult: Secondary | ICD-10-CM | POA: Diagnosis not present

## 2017-05-27 DIAGNOSIS — M791 Myalgia: Secondary | ICD-10-CM | POA: Diagnosis not present

## 2017-05-27 DIAGNOSIS — M542 Cervicalgia: Secondary | ICD-10-CM | POA: Diagnosis not present

## 2017-05-30 HISTORY — PX: ROTATOR CUFF REPAIR: SHX139

## 2017-06-03 DIAGNOSIS — M7541 Impingement syndrome of right shoulder: Secondary | ICD-10-CM | POA: Diagnosis not present

## 2017-06-03 DIAGNOSIS — M75121 Complete rotator cuff tear or rupture of right shoulder, not specified as traumatic: Secondary | ICD-10-CM | POA: Diagnosis not present

## 2017-06-03 DIAGNOSIS — M19011 Primary osteoarthritis, right shoulder: Secondary | ICD-10-CM | POA: Diagnosis not present

## 2017-06-03 DIAGNOSIS — M24111 Other articular cartilage disorders, right shoulder: Secondary | ICD-10-CM | POA: Diagnosis not present

## 2017-06-03 DIAGNOSIS — G8918 Other acute postprocedural pain: Secondary | ICD-10-CM | POA: Diagnosis not present

## 2017-06-16 DIAGNOSIS — M75121 Complete rotator cuff tear or rupture of right shoulder, not specified as traumatic: Secondary | ICD-10-CM | POA: Diagnosis not present

## 2017-07-14 DIAGNOSIS — M75121 Complete rotator cuff tear or rupture of right shoulder, not specified as traumatic: Secondary | ICD-10-CM | POA: Diagnosis not present

## 2017-07-20 DIAGNOSIS — R531 Weakness: Secondary | ICD-10-CM | POA: Diagnosis not present

## 2017-07-20 DIAGNOSIS — S43431D Superior glenoid labrum lesion of right shoulder, subsequent encounter: Secondary | ICD-10-CM | POA: Diagnosis not present

## 2017-07-20 DIAGNOSIS — M7541 Impingement syndrome of right shoulder: Secondary | ICD-10-CM | POA: Diagnosis not present

## 2017-07-20 DIAGNOSIS — M75121 Complete rotator cuff tear or rupture of right shoulder, not specified as traumatic: Secondary | ICD-10-CM | POA: Diagnosis not present

## 2017-07-22 DIAGNOSIS — M75121 Complete rotator cuff tear or rupture of right shoulder, not specified as traumatic: Secondary | ICD-10-CM | POA: Diagnosis not present

## 2017-07-22 DIAGNOSIS — R531 Weakness: Secondary | ICD-10-CM | POA: Diagnosis not present

## 2017-07-22 DIAGNOSIS — M7541 Impingement syndrome of right shoulder: Secondary | ICD-10-CM | POA: Diagnosis not present

## 2017-07-22 DIAGNOSIS — S43431D Superior glenoid labrum lesion of right shoulder, subsequent encounter: Secondary | ICD-10-CM | POA: Diagnosis not present

## 2017-07-26 DIAGNOSIS — S43431D Superior glenoid labrum lesion of right shoulder, subsequent encounter: Secondary | ICD-10-CM | POA: Diagnosis not present

## 2017-07-26 DIAGNOSIS — M7541 Impingement syndrome of right shoulder: Secondary | ICD-10-CM | POA: Diagnosis not present

## 2017-07-26 DIAGNOSIS — M75121 Complete rotator cuff tear or rupture of right shoulder, not specified as traumatic: Secondary | ICD-10-CM | POA: Diagnosis not present

## 2017-07-26 DIAGNOSIS — R531 Weakness: Secondary | ICD-10-CM | POA: Diagnosis not present

## 2017-07-28 DIAGNOSIS — R739 Hyperglycemia, unspecified: Secondary | ICD-10-CM | POA: Diagnosis not present

## 2017-07-28 DIAGNOSIS — R531 Weakness: Secondary | ICD-10-CM | POA: Diagnosis not present

## 2017-07-28 DIAGNOSIS — M7541 Impingement syndrome of right shoulder: Secondary | ICD-10-CM | POA: Diagnosis not present

## 2017-07-28 DIAGNOSIS — I10 Essential (primary) hypertension: Secondary | ICD-10-CM | POA: Diagnosis not present

## 2017-07-28 DIAGNOSIS — S43431D Superior glenoid labrum lesion of right shoulder, subsequent encounter: Secondary | ICD-10-CM | POA: Diagnosis not present

## 2017-07-28 DIAGNOSIS — K210 Gastro-esophageal reflux disease with esophagitis: Secondary | ICD-10-CM | POA: Diagnosis not present

## 2017-07-28 DIAGNOSIS — M75121 Complete rotator cuff tear or rupture of right shoulder, not specified as traumatic: Secondary | ICD-10-CM | POA: Diagnosis not present

## 2017-07-29 DIAGNOSIS — Z6841 Body Mass Index (BMI) 40.0 and over, adult: Secondary | ICD-10-CM | POA: Diagnosis not present

## 2017-07-29 DIAGNOSIS — I10 Essential (primary) hypertension: Secondary | ICD-10-CM | POA: Diagnosis not present

## 2017-07-29 DIAGNOSIS — M542 Cervicalgia: Secondary | ICD-10-CM | POA: Diagnosis not present

## 2017-07-30 DIAGNOSIS — R531 Weakness: Secondary | ICD-10-CM | POA: Diagnosis not present

## 2017-07-30 DIAGNOSIS — S43431D Superior glenoid labrum lesion of right shoulder, subsequent encounter: Secondary | ICD-10-CM | POA: Diagnosis not present

## 2017-07-30 DIAGNOSIS — M7541 Impingement syndrome of right shoulder: Secondary | ICD-10-CM | POA: Diagnosis not present

## 2017-07-30 DIAGNOSIS — M75121 Complete rotator cuff tear or rupture of right shoulder, not specified as traumatic: Secondary | ICD-10-CM | POA: Diagnosis not present

## 2017-08-04 DIAGNOSIS — R531 Weakness: Secondary | ICD-10-CM | POA: Diagnosis not present

## 2017-08-04 DIAGNOSIS — M75121 Complete rotator cuff tear or rupture of right shoulder, not specified as traumatic: Secondary | ICD-10-CM | POA: Diagnosis not present

## 2017-08-04 DIAGNOSIS — M7541 Impingement syndrome of right shoulder: Secondary | ICD-10-CM | POA: Diagnosis not present

## 2017-08-04 DIAGNOSIS — S43431D Superior glenoid labrum lesion of right shoulder, subsequent encounter: Secondary | ICD-10-CM | POA: Diagnosis not present

## 2017-08-06 DIAGNOSIS — S43431D Superior glenoid labrum lesion of right shoulder, subsequent encounter: Secondary | ICD-10-CM | POA: Diagnosis not present

## 2017-08-06 DIAGNOSIS — M7541 Impingement syndrome of right shoulder: Secondary | ICD-10-CM | POA: Diagnosis not present

## 2017-08-06 DIAGNOSIS — R531 Weakness: Secondary | ICD-10-CM | POA: Diagnosis not present

## 2017-08-06 DIAGNOSIS — M75121 Complete rotator cuff tear or rupture of right shoulder, not specified as traumatic: Secondary | ICD-10-CM | POA: Diagnosis not present

## 2017-08-10 DIAGNOSIS — M75121 Complete rotator cuff tear or rupture of right shoulder, not specified as traumatic: Secondary | ICD-10-CM | POA: Diagnosis not present

## 2017-08-10 DIAGNOSIS — S43431D Superior glenoid labrum lesion of right shoulder, subsequent encounter: Secondary | ICD-10-CM | POA: Diagnosis not present

## 2017-08-10 DIAGNOSIS — M7541 Impingement syndrome of right shoulder: Secondary | ICD-10-CM | POA: Diagnosis not present

## 2017-08-10 DIAGNOSIS — R531 Weakness: Secondary | ICD-10-CM | POA: Diagnosis not present

## 2017-08-12 DIAGNOSIS — M75121 Complete rotator cuff tear or rupture of right shoulder, not specified as traumatic: Secondary | ICD-10-CM | POA: Diagnosis not present

## 2017-08-12 DIAGNOSIS — M7541 Impingement syndrome of right shoulder: Secondary | ICD-10-CM | POA: Diagnosis not present

## 2017-08-12 DIAGNOSIS — S43431D Superior glenoid labrum lesion of right shoulder, subsequent encounter: Secondary | ICD-10-CM | POA: Diagnosis not present

## 2017-08-12 DIAGNOSIS — R531 Weakness: Secondary | ICD-10-CM | POA: Diagnosis not present

## 2017-08-16 DIAGNOSIS — M7541 Impingement syndrome of right shoulder: Secondary | ICD-10-CM | POA: Diagnosis not present

## 2017-08-16 DIAGNOSIS — R531 Weakness: Secondary | ICD-10-CM | POA: Diagnosis not present

## 2017-08-16 DIAGNOSIS — S43431D Superior glenoid labrum lesion of right shoulder, subsequent encounter: Secondary | ICD-10-CM | POA: Diagnosis not present

## 2017-08-16 DIAGNOSIS — M75121 Complete rotator cuff tear or rupture of right shoulder, not specified as traumatic: Secondary | ICD-10-CM | POA: Diagnosis not present

## 2017-08-19 DIAGNOSIS — S43431D Superior glenoid labrum lesion of right shoulder, subsequent encounter: Secondary | ICD-10-CM | POA: Diagnosis not present

## 2017-08-19 DIAGNOSIS — M75121 Complete rotator cuff tear or rupture of right shoulder, not specified as traumatic: Secondary | ICD-10-CM | POA: Diagnosis not present

## 2017-08-19 DIAGNOSIS — M7541 Impingement syndrome of right shoulder: Secondary | ICD-10-CM | POA: Diagnosis not present

## 2017-08-19 DIAGNOSIS — R531 Weakness: Secondary | ICD-10-CM | POA: Diagnosis not present

## 2017-08-25 DIAGNOSIS — S43431D Superior glenoid labrum lesion of right shoulder, subsequent encounter: Secondary | ICD-10-CM | POA: Diagnosis not present

## 2017-08-25 DIAGNOSIS — M75121 Complete rotator cuff tear or rupture of right shoulder, not specified as traumatic: Secondary | ICD-10-CM | POA: Diagnosis not present

## 2017-08-25 DIAGNOSIS — M7541 Impingement syndrome of right shoulder: Secondary | ICD-10-CM | POA: Diagnosis not present

## 2017-08-25 DIAGNOSIS — R531 Weakness: Secondary | ICD-10-CM | POA: Diagnosis not present

## 2017-08-27 DIAGNOSIS — M7541 Impingement syndrome of right shoulder: Secondary | ICD-10-CM | POA: Diagnosis not present

## 2017-08-27 DIAGNOSIS — S43431D Superior glenoid labrum lesion of right shoulder, subsequent encounter: Secondary | ICD-10-CM | POA: Diagnosis not present

## 2017-08-27 DIAGNOSIS — R531 Weakness: Secondary | ICD-10-CM | POA: Diagnosis not present

## 2017-08-27 DIAGNOSIS — M75121 Complete rotator cuff tear or rupture of right shoulder, not specified as traumatic: Secondary | ICD-10-CM | POA: Diagnosis not present

## 2017-08-31 DIAGNOSIS — R531 Weakness: Secondary | ICD-10-CM | POA: Diagnosis not present

## 2017-08-31 DIAGNOSIS — M75121 Complete rotator cuff tear or rupture of right shoulder, not specified as traumatic: Secondary | ICD-10-CM | POA: Diagnosis not present

## 2017-08-31 DIAGNOSIS — M7541 Impingement syndrome of right shoulder: Secondary | ICD-10-CM | POA: Diagnosis not present

## 2017-08-31 DIAGNOSIS — S43431D Superior glenoid labrum lesion of right shoulder, subsequent encounter: Secondary | ICD-10-CM | POA: Diagnosis not present

## 2017-09-03 DIAGNOSIS — R531 Weakness: Secondary | ICD-10-CM | POA: Diagnosis not present

## 2017-09-03 DIAGNOSIS — S43431D Superior glenoid labrum lesion of right shoulder, subsequent encounter: Secondary | ICD-10-CM | POA: Diagnosis not present

## 2017-09-03 DIAGNOSIS — M7541 Impingement syndrome of right shoulder: Secondary | ICD-10-CM | POA: Diagnosis not present

## 2017-09-03 DIAGNOSIS — M75121 Complete rotator cuff tear or rupture of right shoulder, not specified as traumatic: Secondary | ICD-10-CM | POA: Diagnosis not present

## 2017-09-06 DIAGNOSIS — R531 Weakness: Secondary | ICD-10-CM | POA: Diagnosis not present

## 2017-09-06 DIAGNOSIS — S43431D Superior glenoid labrum lesion of right shoulder, subsequent encounter: Secondary | ICD-10-CM | POA: Diagnosis not present

## 2017-09-06 DIAGNOSIS — M7541 Impingement syndrome of right shoulder: Secondary | ICD-10-CM | POA: Diagnosis not present

## 2017-09-06 DIAGNOSIS — M75121 Complete rotator cuff tear or rupture of right shoulder, not specified as traumatic: Secondary | ICD-10-CM | POA: Diagnosis not present

## 2017-09-14 DIAGNOSIS — M7541 Impingement syndrome of right shoulder: Secondary | ICD-10-CM | POA: Diagnosis not present

## 2017-09-14 DIAGNOSIS — S43431D Superior glenoid labrum lesion of right shoulder, subsequent encounter: Secondary | ICD-10-CM | POA: Diagnosis not present

## 2017-09-14 DIAGNOSIS — R531 Weakness: Secondary | ICD-10-CM | POA: Diagnosis not present

## 2017-09-14 DIAGNOSIS — M75121 Complete rotator cuff tear or rupture of right shoulder, not specified as traumatic: Secondary | ICD-10-CM | POA: Diagnosis not present

## 2017-09-17 DIAGNOSIS — S43431D Superior glenoid labrum lesion of right shoulder, subsequent encounter: Secondary | ICD-10-CM | POA: Diagnosis not present

## 2017-09-17 DIAGNOSIS — M7541 Impingement syndrome of right shoulder: Secondary | ICD-10-CM | POA: Diagnosis not present

## 2017-09-17 DIAGNOSIS — M75121 Complete rotator cuff tear or rupture of right shoulder, not specified as traumatic: Secondary | ICD-10-CM | POA: Diagnosis not present

## 2017-09-17 DIAGNOSIS — R531 Weakness: Secondary | ICD-10-CM | POA: Diagnosis not present

## 2017-09-21 DIAGNOSIS — M75121 Complete rotator cuff tear or rupture of right shoulder, not specified as traumatic: Secondary | ICD-10-CM | POA: Diagnosis not present

## 2017-09-21 DIAGNOSIS — R531 Weakness: Secondary | ICD-10-CM | POA: Diagnosis not present

## 2017-09-21 DIAGNOSIS — S43431D Superior glenoid labrum lesion of right shoulder, subsequent encounter: Secondary | ICD-10-CM | POA: Diagnosis not present

## 2017-09-21 DIAGNOSIS — M7541 Impingement syndrome of right shoulder: Secondary | ICD-10-CM | POA: Diagnosis not present

## 2017-09-23 DIAGNOSIS — S43431D Superior glenoid labrum lesion of right shoulder, subsequent encounter: Secondary | ICD-10-CM | POA: Diagnosis not present

## 2017-09-23 DIAGNOSIS — M75121 Complete rotator cuff tear or rupture of right shoulder, not specified as traumatic: Secondary | ICD-10-CM | POA: Diagnosis not present

## 2017-09-23 DIAGNOSIS — R531 Weakness: Secondary | ICD-10-CM | POA: Diagnosis not present

## 2017-09-23 DIAGNOSIS — M7541 Impingement syndrome of right shoulder: Secondary | ICD-10-CM | POA: Diagnosis not present

## 2017-09-27 DIAGNOSIS — M75121 Complete rotator cuff tear or rupture of right shoulder, not specified as traumatic: Secondary | ICD-10-CM | POA: Diagnosis not present

## 2017-09-28 DIAGNOSIS — S43431D Superior glenoid labrum lesion of right shoulder, subsequent encounter: Secondary | ICD-10-CM | POA: Diagnosis not present

## 2017-09-28 DIAGNOSIS — M7541 Impingement syndrome of right shoulder: Secondary | ICD-10-CM | POA: Diagnosis not present

## 2017-09-28 DIAGNOSIS — R531 Weakness: Secondary | ICD-10-CM | POA: Diagnosis not present

## 2017-09-28 DIAGNOSIS — M75121 Complete rotator cuff tear or rupture of right shoulder, not specified as traumatic: Secondary | ICD-10-CM | POA: Diagnosis not present

## 2017-09-30 DIAGNOSIS — M7541 Impingement syndrome of right shoulder: Secondary | ICD-10-CM | POA: Diagnosis not present

## 2017-09-30 DIAGNOSIS — R531 Weakness: Secondary | ICD-10-CM | POA: Diagnosis not present

## 2017-09-30 DIAGNOSIS — M75121 Complete rotator cuff tear or rupture of right shoulder, not specified as traumatic: Secondary | ICD-10-CM | POA: Diagnosis not present

## 2017-09-30 DIAGNOSIS — S43431D Superior glenoid labrum lesion of right shoulder, subsequent encounter: Secondary | ICD-10-CM | POA: Diagnosis not present

## 2017-10-05 DIAGNOSIS — M25562 Pain in left knee: Secondary | ICD-10-CM | POA: Diagnosis not present

## 2017-10-05 DIAGNOSIS — Z6841 Body Mass Index (BMI) 40.0 and over, adult: Secondary | ICD-10-CM | POA: Diagnosis not present

## 2017-10-15 DIAGNOSIS — M7541 Impingement syndrome of right shoulder: Secondary | ICD-10-CM | POA: Diagnosis not present

## 2017-10-15 DIAGNOSIS — M75121 Complete rotator cuff tear or rupture of right shoulder, not specified as traumatic: Secondary | ICD-10-CM | POA: Diagnosis not present

## 2017-10-15 DIAGNOSIS — S43431D Superior glenoid labrum lesion of right shoulder, subsequent encounter: Secondary | ICD-10-CM | POA: Diagnosis not present

## 2017-10-15 DIAGNOSIS — R531 Weakness: Secondary | ICD-10-CM | POA: Diagnosis not present

## 2017-10-18 DIAGNOSIS — M25562 Pain in left knee: Secondary | ICD-10-CM | POA: Diagnosis not present

## 2017-10-26 DIAGNOSIS — M75121 Complete rotator cuff tear or rupture of right shoulder, not specified as traumatic: Secondary | ICD-10-CM | POA: Diagnosis not present

## 2017-10-26 DIAGNOSIS — R531 Weakness: Secondary | ICD-10-CM | POA: Diagnosis not present

## 2017-10-26 DIAGNOSIS — M7541 Impingement syndrome of right shoulder: Secondary | ICD-10-CM | POA: Diagnosis not present

## 2017-10-26 DIAGNOSIS — S43431D Superior glenoid labrum lesion of right shoulder, subsequent encounter: Secondary | ICD-10-CM | POA: Diagnosis not present

## 2017-11-02 DIAGNOSIS — M75121 Complete rotator cuff tear or rupture of right shoulder, not specified as traumatic: Secondary | ICD-10-CM | POA: Diagnosis not present

## 2017-11-02 DIAGNOSIS — M7541 Impingement syndrome of right shoulder: Secondary | ICD-10-CM | POA: Diagnosis not present

## 2017-11-02 DIAGNOSIS — S43431D Superior glenoid labrum lesion of right shoulder, subsequent encounter: Secondary | ICD-10-CM | POA: Diagnosis not present

## 2017-11-02 DIAGNOSIS — R531 Weakness: Secondary | ICD-10-CM | POA: Diagnosis not present

## 2017-11-05 DIAGNOSIS — R531 Weakness: Secondary | ICD-10-CM | POA: Diagnosis not present

## 2017-11-05 DIAGNOSIS — M75121 Complete rotator cuff tear or rupture of right shoulder, not specified as traumatic: Secondary | ICD-10-CM | POA: Diagnosis not present

## 2017-11-05 DIAGNOSIS — S43431D Superior glenoid labrum lesion of right shoulder, subsequent encounter: Secondary | ICD-10-CM | POA: Diagnosis not present

## 2017-11-05 DIAGNOSIS — M7541 Impingement syndrome of right shoulder: Secondary | ICD-10-CM | POA: Diagnosis not present

## 2017-11-15 DIAGNOSIS — M1712 Unilateral primary osteoarthritis, left knee: Secondary | ICD-10-CM | POA: Diagnosis not present

## 2017-11-15 DIAGNOSIS — M75121 Complete rotator cuff tear or rupture of right shoulder, not specified as traumatic: Secondary | ICD-10-CM | POA: Diagnosis not present

## 2017-11-16 DIAGNOSIS — M7541 Impingement syndrome of right shoulder: Secondary | ICD-10-CM | POA: Diagnosis not present

## 2017-11-16 DIAGNOSIS — M75121 Complete rotator cuff tear or rupture of right shoulder, not specified as traumatic: Secondary | ICD-10-CM | POA: Diagnosis not present

## 2017-11-16 DIAGNOSIS — R531 Weakness: Secondary | ICD-10-CM | POA: Diagnosis not present

## 2017-11-16 DIAGNOSIS — S43431D Superior glenoid labrum lesion of right shoulder, subsequent encounter: Secondary | ICD-10-CM | POA: Diagnosis not present

## 2017-11-22 DIAGNOSIS — M7541 Impingement syndrome of right shoulder: Secondary | ICD-10-CM | POA: Diagnosis not present

## 2017-11-22 DIAGNOSIS — R531 Weakness: Secondary | ICD-10-CM | POA: Diagnosis not present

## 2017-11-22 DIAGNOSIS — M75121 Complete rotator cuff tear or rupture of right shoulder, not specified as traumatic: Secondary | ICD-10-CM | POA: Diagnosis not present

## 2017-11-22 DIAGNOSIS — S43431D Superior glenoid labrum lesion of right shoulder, subsequent encounter: Secondary | ICD-10-CM | POA: Diagnosis not present

## 2017-11-29 DIAGNOSIS — I10 Essential (primary) hypertension: Secondary | ICD-10-CM | POA: Diagnosis not present

## 2017-11-29 DIAGNOSIS — S43431D Superior glenoid labrum lesion of right shoulder, subsequent encounter: Secondary | ICD-10-CM | POA: Diagnosis not present

## 2017-11-29 DIAGNOSIS — R7301 Impaired fasting glucose: Secondary | ICD-10-CM | POA: Diagnosis not present

## 2017-11-29 DIAGNOSIS — K210 Gastro-esophageal reflux disease with esophagitis: Secondary | ICD-10-CM | POA: Diagnosis not present

## 2017-11-29 DIAGNOSIS — M75121 Complete rotator cuff tear or rupture of right shoulder, not specified as traumatic: Secondary | ICD-10-CM | POA: Diagnosis not present

## 2017-11-29 DIAGNOSIS — R739 Hyperglycemia, unspecified: Secondary | ICD-10-CM | POA: Diagnosis not present

## 2017-11-29 DIAGNOSIS — M7541 Impingement syndrome of right shoulder: Secondary | ICD-10-CM | POA: Diagnosis not present

## 2017-11-29 DIAGNOSIS — E1165 Type 2 diabetes mellitus with hyperglycemia: Secondary | ICD-10-CM | POA: Diagnosis not present

## 2017-11-29 DIAGNOSIS — R531 Weakness: Secondary | ICD-10-CM | POA: Diagnosis not present

## 2017-12-01 DIAGNOSIS — R7301 Impaired fasting glucose: Secondary | ICD-10-CM | POA: Diagnosis not present

## 2017-12-01 DIAGNOSIS — M542 Cervicalgia: Secondary | ICD-10-CM | POA: Diagnosis not present

## 2017-12-01 DIAGNOSIS — K210 Gastro-esophageal reflux disease with esophagitis: Secondary | ICD-10-CM | POA: Diagnosis not present

## 2017-12-01 DIAGNOSIS — I10 Essential (primary) hypertension: Secondary | ICD-10-CM | POA: Diagnosis not present

## 2017-12-08 DIAGNOSIS — M1712 Unilateral primary osteoarthritis, left knee: Secondary | ICD-10-CM | POA: Diagnosis not present

## 2017-12-08 DIAGNOSIS — M25562 Pain in left knee: Secondary | ICD-10-CM | POA: Diagnosis not present

## 2017-12-09 DIAGNOSIS — S43431D Superior glenoid labrum lesion of right shoulder, subsequent encounter: Secondary | ICD-10-CM | POA: Diagnosis not present

## 2017-12-09 DIAGNOSIS — M75121 Complete rotator cuff tear or rupture of right shoulder, not specified as traumatic: Secondary | ICD-10-CM | POA: Diagnosis not present

## 2017-12-09 DIAGNOSIS — M7541 Impingement syndrome of right shoulder: Secondary | ICD-10-CM | POA: Diagnosis not present

## 2017-12-09 DIAGNOSIS — R531 Weakness: Secondary | ICD-10-CM | POA: Diagnosis not present

## 2017-12-14 DIAGNOSIS — M1712 Unilateral primary osteoarthritis, left knee: Secondary | ICD-10-CM | POA: Diagnosis not present

## 2017-12-14 DIAGNOSIS — M25562 Pain in left knee: Secondary | ICD-10-CM | POA: Diagnosis not present

## 2017-12-17 DIAGNOSIS — M75121 Complete rotator cuff tear or rupture of right shoulder, not specified as traumatic: Secondary | ICD-10-CM | POA: Diagnosis not present

## 2017-12-17 DIAGNOSIS — R531 Weakness: Secondary | ICD-10-CM | POA: Diagnosis not present

## 2017-12-17 DIAGNOSIS — S43431D Superior glenoid labrum lesion of right shoulder, subsequent encounter: Secondary | ICD-10-CM | POA: Diagnosis not present

## 2017-12-17 DIAGNOSIS — M7541 Impingement syndrome of right shoulder: Secondary | ICD-10-CM | POA: Diagnosis not present

## 2017-12-17 DIAGNOSIS — S61219A Laceration without foreign body of unspecified finger without damage to nail, initial encounter: Secondary | ICD-10-CM | POA: Diagnosis not present

## 2017-12-20 DIAGNOSIS — M75121 Complete rotator cuff tear or rupture of right shoulder, not specified as traumatic: Secondary | ICD-10-CM | POA: Diagnosis not present

## 2017-12-20 DIAGNOSIS — R531 Weakness: Secondary | ICD-10-CM | POA: Diagnosis not present

## 2017-12-20 DIAGNOSIS — M1712 Unilateral primary osteoarthritis, left knee: Secondary | ICD-10-CM | POA: Diagnosis not present

## 2017-12-20 DIAGNOSIS — M25662 Stiffness of left knee, not elsewhere classified: Secondary | ICD-10-CM | POA: Diagnosis not present

## 2017-12-20 DIAGNOSIS — Z471 Aftercare following joint replacement surgery: Secondary | ICD-10-CM | POA: Diagnosis not present

## 2017-12-20 DIAGNOSIS — S43431D Superior glenoid labrum lesion of right shoulder, subsequent encounter: Secondary | ICD-10-CM | POA: Diagnosis not present

## 2017-12-20 DIAGNOSIS — M25562 Pain in left knee: Secondary | ICD-10-CM | POA: Diagnosis not present

## 2017-12-20 DIAGNOSIS — R262 Difficulty in walking, not elsewhere classified: Secondary | ICD-10-CM | POA: Diagnosis not present

## 2017-12-20 DIAGNOSIS — M7541 Impingement syndrome of right shoulder: Secondary | ICD-10-CM | POA: Diagnosis not present

## 2017-12-23 DIAGNOSIS — S43431D Superior glenoid labrum lesion of right shoulder, subsequent encounter: Secondary | ICD-10-CM | POA: Diagnosis not present

## 2017-12-23 DIAGNOSIS — R531 Weakness: Secondary | ICD-10-CM | POA: Diagnosis not present

## 2017-12-23 DIAGNOSIS — M75121 Complete rotator cuff tear or rupture of right shoulder, not specified as traumatic: Secondary | ICD-10-CM | POA: Diagnosis not present

## 2017-12-23 DIAGNOSIS — M7541 Impingement syndrome of right shoulder: Secondary | ICD-10-CM | POA: Diagnosis not present

## 2017-12-23 DIAGNOSIS — M25562 Pain in left knee: Secondary | ICD-10-CM | POA: Diagnosis not present

## 2017-12-28 DIAGNOSIS — M25562 Pain in left knee: Secondary | ICD-10-CM | POA: Diagnosis not present

## 2017-12-28 DIAGNOSIS — M1712 Unilateral primary osteoarthritis, left knee: Secondary | ICD-10-CM | POA: Diagnosis not present

## 2018-01-06 DIAGNOSIS — M75121 Complete rotator cuff tear or rupture of right shoulder, not specified as traumatic: Secondary | ICD-10-CM | POA: Diagnosis not present

## 2018-01-06 DIAGNOSIS — R531 Weakness: Secondary | ICD-10-CM | POA: Diagnosis not present

## 2018-01-06 DIAGNOSIS — S43431D Superior glenoid labrum lesion of right shoulder, subsequent encounter: Secondary | ICD-10-CM | POA: Diagnosis not present

## 2018-01-06 DIAGNOSIS — M7541 Impingement syndrome of right shoulder: Secondary | ICD-10-CM | POA: Diagnosis not present

## 2018-01-11 DIAGNOSIS — M1712 Unilateral primary osteoarthritis, left knee: Secondary | ICD-10-CM | POA: Diagnosis not present

## 2018-01-11 DIAGNOSIS — M25562 Pain in left knee: Secondary | ICD-10-CM | POA: Diagnosis not present

## 2018-01-14 DIAGNOSIS — M75121 Complete rotator cuff tear or rupture of right shoulder, not specified as traumatic: Secondary | ICD-10-CM | POA: Diagnosis not present

## 2018-01-14 DIAGNOSIS — R531 Weakness: Secondary | ICD-10-CM | POA: Diagnosis not present

## 2018-01-14 DIAGNOSIS — S43431D Superior glenoid labrum lesion of right shoulder, subsequent encounter: Secondary | ICD-10-CM | POA: Diagnosis not present

## 2018-01-14 DIAGNOSIS — M7541 Impingement syndrome of right shoulder: Secondary | ICD-10-CM | POA: Diagnosis not present

## 2018-01-17 DIAGNOSIS — M75121 Complete rotator cuff tear or rupture of right shoulder, not specified as traumatic: Secondary | ICD-10-CM | POA: Diagnosis not present

## 2018-01-17 DIAGNOSIS — M1712 Unilateral primary osteoarthritis, left knee: Secondary | ICD-10-CM | POA: Diagnosis not present

## 2018-01-21 DIAGNOSIS — M75121 Complete rotator cuff tear or rupture of right shoulder, not specified as traumatic: Secondary | ICD-10-CM | POA: Diagnosis not present

## 2018-01-21 DIAGNOSIS — S43431D Superior glenoid labrum lesion of right shoulder, subsequent encounter: Secondary | ICD-10-CM | POA: Diagnosis not present

## 2018-01-21 DIAGNOSIS — M7541 Impingement syndrome of right shoulder: Secondary | ICD-10-CM | POA: Diagnosis not present

## 2018-01-26 DIAGNOSIS — M170 Bilateral primary osteoarthritis of knee: Secondary | ICD-10-CM | POA: Diagnosis not present

## 2018-02-03 DIAGNOSIS — M7541 Impingement syndrome of right shoulder: Secondary | ICD-10-CM | POA: Diagnosis not present

## 2018-02-03 DIAGNOSIS — M75121 Complete rotator cuff tear or rupture of right shoulder, not specified as traumatic: Secondary | ICD-10-CM | POA: Diagnosis not present

## 2018-02-03 DIAGNOSIS — S43431D Superior glenoid labrum lesion of right shoulder, subsequent encounter: Secondary | ICD-10-CM | POA: Diagnosis not present

## 2018-02-11 DIAGNOSIS — M75121 Complete rotator cuff tear or rupture of right shoulder, not specified as traumatic: Secondary | ICD-10-CM | POA: Diagnosis not present

## 2018-02-11 DIAGNOSIS — M7541 Impingement syndrome of right shoulder: Secondary | ICD-10-CM | POA: Diagnosis not present

## 2018-02-11 DIAGNOSIS — S43431D Superior glenoid labrum lesion of right shoulder, subsequent encounter: Secondary | ICD-10-CM | POA: Diagnosis not present

## 2018-02-14 DIAGNOSIS — M7541 Impingement syndrome of right shoulder: Secondary | ICD-10-CM | POA: Diagnosis not present

## 2018-02-14 DIAGNOSIS — S43431D Superior glenoid labrum lesion of right shoulder, subsequent encounter: Secondary | ICD-10-CM | POA: Diagnosis not present

## 2018-02-14 DIAGNOSIS — M75121 Complete rotator cuff tear or rupture of right shoulder, not specified as traumatic: Secondary | ICD-10-CM | POA: Diagnosis not present

## 2018-02-21 DIAGNOSIS — M25562 Pain in left knee: Secondary | ICD-10-CM | POA: Diagnosis not present

## 2018-02-21 DIAGNOSIS — M1712 Unilateral primary osteoarthritis, left knee: Secondary | ICD-10-CM | POA: Diagnosis not present

## 2018-02-23 DIAGNOSIS — M170 Bilateral primary osteoarthritis of knee: Secondary | ICD-10-CM | POA: Diagnosis not present

## 2018-03-23 DIAGNOSIS — N6324 Unspecified lump in the left breast, lower inner quadrant: Secondary | ICD-10-CM | POA: Diagnosis not present

## 2018-03-23 DIAGNOSIS — R928 Other abnormal and inconclusive findings on diagnostic imaging of breast: Secondary | ICD-10-CM | POA: Diagnosis not present

## 2018-03-23 DIAGNOSIS — N6489 Other specified disorders of breast: Secondary | ICD-10-CM | POA: Diagnosis not present

## 2018-03-30 DIAGNOSIS — M542 Cervicalgia: Secondary | ICD-10-CM | POA: Diagnosis not present

## 2018-03-30 DIAGNOSIS — M75121 Complete rotator cuff tear or rupture of right shoulder, not specified as traumatic: Secondary | ICD-10-CM | POA: Diagnosis not present

## 2018-03-30 DIAGNOSIS — K210 Gastro-esophageal reflux disease with esophagitis: Secondary | ICD-10-CM | POA: Diagnosis not present

## 2018-03-30 DIAGNOSIS — I10 Essential (primary) hypertension: Secondary | ICD-10-CM | POA: Diagnosis not present

## 2018-03-30 DIAGNOSIS — M170 Bilateral primary osteoarthritis of knee: Secondary | ICD-10-CM | POA: Diagnosis not present

## 2018-03-30 DIAGNOSIS — R7301 Impaired fasting glucose: Secondary | ICD-10-CM | POA: Diagnosis not present

## 2018-03-30 DIAGNOSIS — E1165 Type 2 diabetes mellitus with hyperglycemia: Secondary | ICD-10-CM | POA: Diagnosis not present

## 2018-04-29 DIAGNOSIS — M542 Cervicalgia: Secondary | ICD-10-CM | POA: Diagnosis not present

## 2018-04-29 DIAGNOSIS — M75121 Complete rotator cuff tear or rupture of right shoulder, not specified as traumatic: Secondary | ICD-10-CM | POA: Diagnosis not present

## 2018-04-29 DIAGNOSIS — M170 Bilateral primary osteoarthritis of knee: Secondary | ICD-10-CM | POA: Diagnosis not present

## 2018-05-18 DIAGNOSIS — G43909 Migraine, unspecified, not intractable, without status migrainosus: Secondary | ICD-10-CM | POA: Diagnosis not present

## 2018-05-18 DIAGNOSIS — E119 Type 2 diabetes mellitus without complications: Secondary | ICD-10-CM | POA: Diagnosis not present

## 2018-05-18 DIAGNOSIS — Z825 Family history of asthma and other chronic lower respiratory diseases: Secondary | ICD-10-CM | POA: Diagnosis not present

## 2018-05-18 DIAGNOSIS — F329 Major depressive disorder, single episode, unspecified: Secondary | ICD-10-CM | POA: Diagnosis not present

## 2018-05-18 DIAGNOSIS — R202 Paresthesia of skin: Secondary | ICD-10-CM | POA: Diagnosis not present

## 2018-05-18 DIAGNOSIS — I63232 Cerebral infarction due to unspecified occlusion or stenosis of left carotid arteries: Secondary | ICD-10-CM | POA: Diagnosis not present

## 2018-05-18 DIAGNOSIS — G43809 Other migraine, not intractable, without status migrainosus: Secondary | ICD-10-CM | POA: Diagnosis not present

## 2018-05-18 DIAGNOSIS — N390 Urinary tract infection, site not specified: Secondary | ICD-10-CM | POA: Diagnosis not present

## 2018-05-18 DIAGNOSIS — M1712 Unilateral primary osteoarthritis, left knee: Secondary | ICD-10-CM | POA: Diagnosis not present

## 2018-05-18 DIAGNOSIS — Z79899 Other long term (current) drug therapy: Secondary | ICD-10-CM | POA: Diagnosis not present

## 2018-05-18 DIAGNOSIS — Z886 Allergy status to analgesic agent status: Secondary | ICD-10-CM | POA: Diagnosis not present

## 2018-05-18 DIAGNOSIS — Z833 Family history of diabetes mellitus: Secondary | ICD-10-CM | POA: Diagnosis not present

## 2018-05-18 DIAGNOSIS — R51 Headache: Secondary | ICD-10-CM | POA: Diagnosis not present

## 2018-05-18 DIAGNOSIS — I10 Essential (primary) hypertension: Secondary | ICD-10-CM | POA: Diagnosis not present

## 2018-05-18 DIAGNOSIS — R29818 Other symptoms and signs involving the nervous system: Secondary | ICD-10-CM | POA: Diagnosis not present

## 2018-05-18 DIAGNOSIS — Z7984 Long term (current) use of oral hypoglycemic drugs: Secondary | ICD-10-CM | POA: Diagnosis not present

## 2018-05-18 DIAGNOSIS — Z8249 Family history of ischemic heart disease and other diseases of the circulatory system: Secondary | ICD-10-CM | POA: Diagnosis not present

## 2018-05-18 DIAGNOSIS — Z96649 Presence of unspecified artificial hip joint: Secondary | ICD-10-CM | POA: Diagnosis not present

## 2018-05-18 DIAGNOSIS — Z809 Family history of malignant neoplasm, unspecified: Secondary | ICD-10-CM | POA: Diagnosis not present

## 2018-05-18 DIAGNOSIS — E1165 Type 2 diabetes mellitus with hyperglycemia: Secondary | ICD-10-CM | POA: Diagnosis not present

## 2018-05-18 DIAGNOSIS — Z9851 Tubal ligation status: Secondary | ICD-10-CM | POA: Diagnosis not present

## 2018-05-19 DIAGNOSIS — G43909 Migraine, unspecified, not intractable, without status migrainosus: Secondary | ICD-10-CM | POA: Diagnosis not present

## 2018-05-19 DIAGNOSIS — E1165 Type 2 diabetes mellitus with hyperglycemia: Secondary | ICD-10-CM | POA: Diagnosis not present

## 2018-05-20 DIAGNOSIS — N390 Urinary tract infection, site not specified: Secondary | ICD-10-CM | POA: Diagnosis not present

## 2018-05-20 DIAGNOSIS — G43909 Migraine, unspecified, not intractable, without status migrainosus: Secondary | ICD-10-CM | POA: Diagnosis not present

## 2018-05-30 DIAGNOSIS — I10 Essential (primary) hypertension: Secondary | ICD-10-CM | POA: Diagnosis not present

## 2018-05-30 DIAGNOSIS — G43809 Other migraine, not intractable, without status migrainosus: Secondary | ICD-10-CM | POA: Diagnosis not present

## 2018-05-30 DIAGNOSIS — E1165 Type 2 diabetes mellitus with hyperglycemia: Secondary | ICD-10-CM | POA: Diagnosis not present

## 2018-05-30 DIAGNOSIS — Z6841 Body Mass Index (BMI) 40.0 and over, adult: Secondary | ICD-10-CM | POA: Diagnosis not present

## 2018-06-10 DIAGNOSIS — M170 Bilateral primary osteoarthritis of knee: Secondary | ICD-10-CM | POA: Diagnosis not present

## 2018-06-10 DIAGNOSIS — M75121 Complete rotator cuff tear or rupture of right shoulder, not specified as traumatic: Secondary | ICD-10-CM | POA: Diagnosis not present

## 2018-06-19 DIAGNOSIS — E1165 Type 2 diabetes mellitus with hyperglycemia: Secondary | ICD-10-CM | POA: Diagnosis not present

## 2018-06-19 DIAGNOSIS — I10 Essential (primary) hypertension: Secondary | ICD-10-CM | POA: Diagnosis not present

## 2018-06-19 DIAGNOSIS — G43809 Other migraine, not intractable, without status migrainosus: Secondary | ICD-10-CM | POA: Diagnosis not present

## 2018-07-20 ENCOUNTER — Other Ambulatory Visit: Payer: Self-pay | Admitting: Orthopedic Surgery

## 2018-07-21 NOTE — Pre-Procedure Instructions (Signed)
Donna Casey  07/21/2018      [id number] - [place], [place] - [address] [place] [id number] Phone: [phone] Fax: [phone]    Your procedure is scheduled on August 02, 2018.  [facility] AM.  Call this number if you have problems the morning of surgery:  [phone]   Remember:  Do not eat or drink after midnight.    Take these medicines the morning of surgery with A SIP OF WATER  Amlodipine (norvasc) Methocarbamol (roboxin) ompeprazole (prilosec)  7 days prior to surgery STOP taking any meloxicam (mobic), Aspirin (unless otherwise instructed by your surgeon), Aleve, Naproxen, Ibuprofen, Motrin, Advil, Goody's, BC's, all herbal medications, fish oil, and all vitamins  WHAT DO I DO ABOUT MY DIABETES MEDICATION?  Marland Kitchen. Do not take oral diabetes medicines (pills) the morning of surgery-metformin (glucophage).  Reviewed and Endorsed by [facility] Patient Education Committee, August 2015  How to Manage Your Diabetes Before and After Surgery  Why is it important to control my blood sugar before and after surgery? . Improving blood sugar levels before and after surgery helps healing and can limit problems. . A way of improving blood sugar control is eating a healthy diet by: o  Eating less sugar and carbohydrates o  Increasing activity/exercise o  Talking with your doctor about reaching your blood sugar goals . High blood sugars (greater than 180 mg/dL) can raise your risk of infections and slow your recovery, so you will need to focus on controlling your diabetes during the weeks before surgery. . Make sure that the doctor who takes care of your diabetes knows about your planned surgery including the date and location.  How do I manage my blood sugar before surgery? . Check your blood sugar at least 4 times a day, starting 2 days before surgery, to make sure that  the level is not too high or low. o Check your blood sugar the morning of your surgery when you wake up and every 2 hours until you get to the Short Stay unit. . If your blood sugar is less than 70 mg/dL, you will need to treat for low blood sugar: o Do not take insulin. o Treat a low blood sugar (less than 70 mg/dL) with  cup of clear juice (cranberry or apple), 4 glucose tablets, OR glucose gel. Recheck blood sugar in 15 minutes after treatment (to make sure it is greater than 70 mg/dL). If your blood sugar is not greater than 70 mg/dL on recheck, call [phone] o  for further instructions. . Report your blood sugar to the short stay nurse when you get to Short Stay.  . If you are admitted to the hospital after surgery: o Your blood sugar will be checked by the staff and you will probably be given insulin after surgery (instead of oral diabetes medicines) to make sure you have good blood sugar levels. o The goal for blood sugar control after surgery is 80-180 mg/dL.    Contacts, dentures or bridgework may not be worn into surgery.  Leave your suitcase in the car.  After surgery it may be brought to your room.  For patients admitted to the hospital, discharge time will be determined by your treatment team.  Patients discharged the day of surgery will not be allowed to drive home.    [place]- Preparing For Surgery  Before  surgery, you can play an important role. Because skin is not sterile, your skin needs to be as free of germs as possible. You can reduce the number of germs on your skin by washing with CHG (chlorahexidine gluconate) Soap before surgery.  CHG is an antiseptic cleaner which kills germs and bonds with the skin to continue killing germs even after washing.    Oral Hygiene is also important to reduce your risk of infection.  Remember - BRUSH YOUR TEETH THE MORNING OF SURGERY WITH YOUR REGULAR TOOTHPASTE  Please do not use if you have an allergy to CHG or antibacterial  soaps. If your skin becomes reddened/irritated stop using the CHG.  Do not shave (including legs and underarms) for at least 48 hours prior to first CHG shower. It is OK to shave your face.  Please follow these instructions carefully.   1. Shower the NIGHT BEFORE SURGERY and the MORNING OF SURGERY with CHG.   2. If you chose to wash your hair, wash your hair first as usual with your normal shampoo.  3. After you shampoo, rinse your hair and body thoroughly to remove the shampoo.  4. Use CHG as you would any other liquid soap. You can apply CHG directly to the skin and wash gently with a scrungie or a clean washcloth.   5. Apply the CHG Soap to your body ONLY FROM THE NECK DOWN.  Do not use on open wounds or open sores. Avoid contact with your eyes, ears, mouth and genitals (private parts). Wash Face and genitals (private parts)  with your normal soap.  6. Wash thoroughly, paying special attention to the area where your surgery will be performed.  7. Thoroughly rinse your body with warm water from the neck down.  8. DO NOT shower/wash with your normal soap after using and rinsing off the CHG Soap.  9. Pat yourself dry with a CLEAN TOWEL.  10. Wear CLEAN PAJAMAS to bed the night before surgery, wear comfortable clothes the morning of surgery  11. Place CLEAN SHEETS on your bed the night of your first shower and DO NOT SLEEP WITH PETS.  Day of Surgery:  Do not apply any deodorants/lotions.  Please wear clean clothes to the hospital/surgery center.   Remember to brush your teeth WITH YOUR REGULAR TOOTHPASTE.              Do not wear jewelry, make-up or nail polish.  Do not wear lotions, powders, or perfumes, or deodorant.  Do not shave 48 hours prior to surgery.    Do not bring valuables to the hospital.   [facility] is not responsible for any belongings or valuables.    Please read over the following fact sheets that you were given. Pain Booklet, Coughing and Deep Breathing,  MRSA Information and Surgical Site Infection Prevention

## 2018-07-22 ENCOUNTER — Other Ambulatory Visit: Payer: Self-pay

## 2018-07-22 ENCOUNTER — Encounter (HOSPITAL_COMMUNITY)
Admission: RE | Admit: 2018-07-22 | Discharge: 2018-07-22 | Disposition: A | Discharge status: Home / Self Care | Payer: BLUE CROSS/BLUE SHIELD | Source: Ambulatory Visit | Attending: Orthopedic Surgery | Admitting: Orthopedic Surgery

## 2018-07-22 ENCOUNTER — Encounter (HOSPITAL_COMMUNITY): Payer: Self-pay | Admitting: Urology

## 2018-07-22 DIAGNOSIS — R202 Paresthesia of skin: Secondary | ICD-10-CM | POA: Insufficient documentation

## 2018-07-22 DIAGNOSIS — E119 Type 2 diabetes mellitus without complications: Secondary | ICD-10-CM | POA: Insufficient documentation

## 2018-07-22 DIAGNOSIS — I10 Essential (primary) hypertension: Secondary | ICD-10-CM | POA: Insufficient documentation

## 2018-07-22 DIAGNOSIS — Z7984 Long term (current) use of oral hypoglycemic drugs: Secondary | ICD-10-CM | POA: Diagnosis not present

## 2018-07-22 DIAGNOSIS — R9431 Abnormal electrocardiogram [ECG] [EKG]: Secondary | ICD-10-CM | POA: Diagnosis not present

## 2018-07-22 DIAGNOSIS — K219 Gastro-esophageal reflux disease without esophagitis: Secondary | ICD-10-CM | POA: Diagnosis not present

## 2018-07-22 DIAGNOSIS — Z981 Arthrodesis status: Secondary | ICD-10-CM | POA: Insufficient documentation

## 2018-07-22 DIAGNOSIS — R200 Anesthesia of skin: Secondary | ICD-10-CM | POA: Diagnosis not present

## 2018-07-22 DIAGNOSIS — Z96641 Presence of right artificial hip joint: Secondary | ICD-10-CM | POA: Diagnosis not present

## 2018-07-22 DIAGNOSIS — Z01810 Encounter for preprocedural cardiovascular examination: Secondary | ICD-10-CM | POA: Diagnosis not present

## 2018-07-22 DIAGNOSIS — Z79899 Other long term (current) drug therapy: Secondary | ICD-10-CM | POA: Insufficient documentation

## 2018-07-22 DIAGNOSIS — Z01818 Encounter for other preprocedural examination: Secondary | ICD-10-CM | POA: Insufficient documentation

## 2018-07-22 DIAGNOSIS — K760 Fatty (change of) liver, not elsewhere classified: Secondary | ICD-10-CM | POA: Diagnosis not present

## 2018-07-22 DIAGNOSIS — Z01812 Encounter for preprocedural laboratory examination: Secondary | ICD-10-CM | POA: Insufficient documentation

## 2018-07-22 DIAGNOSIS — M1712 Unilateral primary osteoarthritis, left knee: Secondary | ICD-10-CM | POA: Diagnosis not present

## 2018-07-22 HISTORY — DX: Fatty (change of) liver, not elsewhere classified: K76.0

## 2018-07-22 HISTORY — DX: Family history of other specified conditions: Z84.89

## 2018-07-22 HISTORY — DX: Unspecified osteoarthritis, unspecified site: M19.90

## 2018-07-22 HISTORY — DX: Type 2 diabetes mellitus without complications: E11.9

## 2018-07-22 LAB — BASIC METABOLIC PANEL
Anion gap: 14 (ref 5–15)
BUN: 17 mg/dL (ref 6–20)
CO2: 20 mmol/L — AB (ref 22–32)
CREATININE: 0.92 mg/dL (ref 0.44–1.00)
Calcium: 9.3 mg/dL (ref 8.9–10.3)
Chloride: 105 mmol/L (ref 98–111)
GFR calc non Af Amer: >60 mL/min (ref >60)
Glucose, Bld: 160 mg/dL — ABNORMAL HIGH (ref 70–99)
Potassium: 3.5 mmol/L (ref 3.5–5.1)
Sodium: 139 mmol/L (ref 135–145)

## 2018-07-22 LAB — HEMOGLOBIN A1C
Hgb A1c MFr Bld: 6.5 % — ABNORMAL HIGH (ref 4.8–5.6)
Mean Plasma Glucose: 139.85 mg/dL

## 2018-07-22 LAB — CBC
HCT: 43.2 % (ref 36.0–46.0)
Hemoglobin: 13.6 g/dL (ref 12.0–15.0)
MCH: 29.2 pg (ref 26.0–34.0)
MCHC: 31.5 g/dL (ref 30.0–36.0)
MCV: 92.7 fL (ref 78.0–100.0)
PLATELETS: 368 10*3/uL (ref 150–400)
RBC: 4.66 MIL/uL (ref 3.87–5.11)
RDW: 12.6 % (ref 11.5–15.5)
WBC: 5.9 10*3/uL (ref 4.0–10.5)

## 2018-07-22 LAB — SURGICAL PCR SCREEN
MRSA, PCR: NEGATIVE
STAPHYLOCOCCUS AUREUS: NEGATIVE

## 2018-07-22 LAB — GLUCOSE, CAPILLARY: Glucose-Capillary: 163 mg/dL — ABNORMAL HIGH (ref 70–99)

## 2018-07-22 NOTE — Progress Notes (Signed)
PCP - Fara ChutePaul Sasser- [facility]- pt is to see on 07/27/18 for surgical clearance Cardiologist - denies, pt does report long history of palpitations with no other symptoms that her PCP is aware of  EKG - 07/22/2018  ECHO - [facility]-   [facility] was [facility] D/t elevated BP and elevated CBG in June 2019. Pt reports possible ECHO but does not believe she had an EKG. Discharge summaries, EKG tracing, and ECHO requested from [facility]. Pt reports now CBG 140-160 at home and she is taking Metformin. A1c drawn today  Anesthesia review: recent hospitalization for elevated BP and HTN, [facility]. Need to request records from PCP??  Patient denies shortness of breath, fever, cough and chest pain at PAT appointment   Patient verbalized understanding of instructions that were given to them at the PAT appointment. Patient was also instructed that they will need to review over the PAT instructions again at home before surgery.

## 2018-07-25 NOTE — Progress Notes (Addendum)
Anesthesia Chart Review:  Case:  [id number] Date/Time:  08/02/18 0715   Procedure:  TOTAL KNEE ARTHROPLASTY (Left )   Anesthesia type:  Choice   Pre-op diagnosis:  djd left knee   Location:  [facility] OR ROOM 04 / [facility]   Surgeon:  Marchia Bond, MD      DISCUSSION: Patient is a 57 year old female scheduled for the above procedure.   History includes never smoker, post-operative N/V, palpitations, DM2, GERD, anemia, fatty liver/"enlarged liver", right hand paresthesia (now s/p ACDF C3-4-5-6 ~11/2016). BMI is consistent with obesity.  - Admited 05/18/18 to [facility] for transient paresthesia of the left side of her face and left upper extremity in the setting of headache (with known history of migraines). Telemetry neurology consulted for possible TIA, but determined she was actually having a complex migraine.  CT of the head and CTA of the head and neck were "negative."  Patient reported she has a scheduled medical clearance visit with Dr. Quintin Alto on 07/27/18, so preoperative EKG and labs forwarded to [facility]. Her EKG appears stable.   Chart will be left for follow-up medical clearance records.  ADDENDUM 07/28/18 5:38 PM: Patient was seen by Dr. Quintin Alto today for preoperative evaluation. BP 124/82. He felt labs and EKG results were okay. He cleared her for surgery from a medical and cardiac standpoint.   VS: BP (!) 145/96 Comment: patient states that she hasnt taken bp meds  Pulse 100   Temp 36.5 C   Resp 20   Ht _0  (1.753 m)   Wt 118.7 kg   SpO2 99%   BMI 38.65 kg/m   PROVIDERS: Sasser, Silvestre Moment, MD is PCP at [facility].  No cardiologist. She saw neurologist Sarina Ill, MD in 2018.   LABS: Preoperative labs noted. CBC WNL. Creatinine 0.92. History of fatty liver and "enlarged liver", although no mention of elevated LFTs. Only BMET done with PAT labs (too late for add HFP); however, previous LFTs on 03/11/17 were normal except a minimally elevated ALT  of 48 and, most recently on 05/18/18 ([facility]), ALT 51, AST 36.1, alk phos 91, total bilirubin 0.6. CBC at PAT showed a normal platelet count. With no recent history of significantly elevated LFTs and stable mildly elevated ALT on 05/18/18, I do no think she will need repeat HFP prior to surgery. (all labs ordered are listed, but only abnormal results are displayed)  Labs Reviewed  GLUCOSE, CAPILLARY - Abnormal; Notable for the following components:      Result Value   Glucose-Capillary 163 (*)    All other components within normal limits  BASIC METABOLIC PANEL - Abnormal; Notable for the following components:   CO2 20 (*)    Glucose, Bld 160 (*)    All other components within normal limits  HEMOGLOBIN A1C - Abnormal; Notable for the following components:   Hgb A1c MFr Bld 6.5 (*)    All other components within normal limits  SURGICAL PCR SCREEN  CBC    IMAGES: CTA head/neck 05/18/18 [facility]): Impression: 1.  Minimal atherosclerotic changes involving the left carotid bifurcation without significant stenosis.   2. No other significant vascular disease in the neck. 3.  Cervical spine fusion C3-4, C4-5, and C5-6. 4.  Normal variant CTA circle of Willis without significant proximal stenosis, aneurysm, or branch vessel occlusion.  MRI C-spine 04/17/17: IMPRESSION: 1. Interval ACDF at C3-C4-C5-C6, with improvement of impingement at these levels. There is some residual spondylosis, and degenerative  disc disease at the unfused levels, contributing to moderate impingement at T1- 2; mild to moderate impingement at C5-6 ; and mild impingement at C3-4 and C4-5, as detailed above. 2. Mild prominence of the visualized portion of the contour of the thyroid gland.  MRI L-spine 03/24/17: IMPRESSION:   Abnormal MRI scan lumbar spine showing mild disc and facet degenerative changes throughout most prominent at L5-S1 where there is moderate bilateral foraminal narrowing but without  definite compression  EKG: 07/22/18: NSR, moderate voltage criteria for LVH. may be normal variant. Inferior infarct, age undetermined. EKG was not felt significantly changed when compared to previous tracing 07/05/09 (in [place]). Also stable when compared to 05/19/18 tracing from [facility].    CV: Echo 05/19/18 [facility]): Conclusions: 1.  Normal valves. 2.  Normal left ventricular size and contraction (estimated ejection fraction greater than 55%); normal left atrial chamber size. 3.  Normal right ventricle and right atrium.  Past Medical History:  Diagnosis Date  . Anemia   . Arthritis   . Diabetes mellitus without complication ([place])   . Enlarged liver   . Family history of adverse reaction to anesthesia    N/V  . Fatty liver   . GERD (gastroesophageal reflux disease)   . H pylori ulcer   . Heart palpitations   . Hypertension   . Numbness and tingling in hands    right hand  . PONV (postoperative nausea and vomiting)     Past Surgical History:  Procedure Laterality Date  . BACK SURGERY     disk removal  . CERVICAL FUSION    . COLONOSCOPY    . [place] N/A 07/14/2016   Procedure: DILATATION & CURETTAGE/HYSTEROSCOPY WITH NOVASURE ABLATION;  Surgeon: Olga Millers, MD;  Location: [facility];  Service: Gynecology;  Laterality: N/A;  . MOUTH SURGERY    . ROTATOR CUFF REPAIR     July 2018  . TOTAL HIP ARTHROPLASTY Right   . TUBAL LIGATION      MEDICATIONS: . amLODipine (NORVASC) 5 MG tablet  . hydrochlorothiazide (HYDRODIURIL) 25 MG tablet  . losartan (COZAAR) 25 MG tablet  . meloxicam (MOBIC) 15 MG tablet  . metFORMIN (GLUCOPHAGE-XR) 500 MG 24 hr tablet  . methocarbamol (ROBAXIN) 750 MG tablet  . omeprazole (PRILOSEC) 20 MG capsule   No current facility-administered medications for this encounter.     George Hugh [facility] Short Stay Center/Anesthesiology Phone [phone] 07/26/2018 5:54  PM

## 2018-07-28 DIAGNOSIS — I10 Essential (primary) hypertension: Secondary | ICD-10-CM | POA: Diagnosis not present

## 2018-07-28 DIAGNOSIS — K210 Gastro-esophageal reflux disease with esophagitis: Secondary | ICD-10-CM | POA: Diagnosis not present

## 2018-07-28 DIAGNOSIS — E1165 Type 2 diabetes mellitus with hyperglycemia: Secondary | ICD-10-CM | POA: Diagnosis not present

## 2018-07-28 DIAGNOSIS — M542 Cervicalgia: Secondary | ICD-10-CM | POA: Diagnosis not present

## 2018-07-28 DIAGNOSIS — R7301 Impaired fasting glucose: Secondary | ICD-10-CM | POA: Diagnosis not present

## 2018-08-02 ENCOUNTER — Other Ambulatory Visit: Payer: Self-pay

## 2018-08-02 ENCOUNTER — Encounter (HOSPITAL_COMMUNITY)
Admission: RE | Disposition: A | Discharge status: Home Health | Payer: Self-pay | Source: Home / Self Care | Attending: Orthopedic Surgery

## 2018-08-02 ENCOUNTER — Inpatient Hospital Stay (HOSPITAL_COMMUNITY)
Admission: RE | Admit: 2018-08-02 | Discharge: 2018-08-05 | DRG: 470 | Disposition: A | Discharge status: Home Health | Payer: BLUE CROSS/BLUE SHIELD | Attending: Orthopedic Surgery | Admitting: Orthopedic Surgery

## 2018-08-02 ENCOUNTER — Encounter (HOSPITAL_COMMUNITY): Payer: Self-pay

## 2018-08-02 ENCOUNTER — Inpatient Hospital Stay (HOSPITAL_COMMUNITY): Payer: BLUE CROSS/BLUE SHIELD | Admitting: Vascular Surgery

## 2018-08-02 ENCOUNTER — Inpatient Hospital Stay (HOSPITAL_COMMUNITY): Payer: BLUE CROSS/BLUE SHIELD

## 2018-08-02 ENCOUNTER — Inpatient Hospital Stay (HOSPITAL_COMMUNITY): Payer: BLUE CROSS/BLUE SHIELD | Admitting: Certified Registered Nurse Anesthetist

## 2018-08-02 DIAGNOSIS — Z96641 Presence of right artificial hip joint: Secondary | ICD-10-CM | POA: Diagnosis present

## 2018-08-02 DIAGNOSIS — Z7984 Long term (current) use of oral hypoglycemic drugs: Secondary | ICD-10-CM

## 2018-08-02 DIAGNOSIS — Z23 Encounter for immunization: Secondary | ICD-10-CM | POA: Diagnosis not present

## 2018-08-02 DIAGNOSIS — Z96659 Presence of unspecified artificial knee joint: Secondary | ICD-10-CM

## 2018-08-02 DIAGNOSIS — K760 Fatty (change of) liver, not elsewhere classified: Secondary | ICD-10-CM | POA: Diagnosis present

## 2018-08-02 DIAGNOSIS — E669 Obesity, unspecified: Secondary | ICD-10-CM | POA: Diagnosis present

## 2018-08-02 DIAGNOSIS — K219 Gastro-esophageal reflux disease without esophagitis: Secondary | ICD-10-CM | POA: Diagnosis present

## 2018-08-02 DIAGNOSIS — Z791 Long term (current) use of non-steroidal anti-inflammatories (NSAID): Secondary | ICD-10-CM | POA: Diagnosis not present

## 2018-08-02 DIAGNOSIS — Z96652 Presence of left artificial knee joint: Secondary | ICD-10-CM | POA: Diagnosis not present

## 2018-08-02 DIAGNOSIS — Z981 Arthrodesis status: Secondary | ICD-10-CM

## 2018-08-02 DIAGNOSIS — Z471 Aftercare following joint replacement surgery: Secondary | ICD-10-CM | POA: Diagnosis not present

## 2018-08-02 DIAGNOSIS — Z6838 Body mass index (BMI) 38.0-38.9, adult: Secondary | ICD-10-CM

## 2018-08-02 DIAGNOSIS — I10 Essential (primary) hypertension: Secondary | ICD-10-CM | POA: Diagnosis not present

## 2018-08-02 DIAGNOSIS — E119 Type 2 diabetes mellitus without complications: Secondary | ICD-10-CM | POA: Diagnosis present

## 2018-08-02 DIAGNOSIS — G8918 Other acute postprocedural pain: Secondary | ICD-10-CM | POA: Diagnosis not present

## 2018-08-02 DIAGNOSIS — M1712 Unilateral primary osteoarthritis, left knee: Principal | ICD-10-CM

## 2018-08-02 HISTORY — DX: Unilateral primary osteoarthritis, left knee: M17.12

## 2018-08-02 HISTORY — PX: TOTAL KNEE ARTHROPLASTY: SHX125

## 2018-08-02 LAB — GLUCOSE, CAPILLARY
GLUCOSE-CAPILLARY: 136 mg/dL — AB (ref 70–99)
Glucose-Capillary: 156 mg/dL — ABNORMAL HIGH (ref 70–99)

## 2018-08-02 SURGERY — ARTHROPLASTY, KNEE, TOTAL
Anesthesia: Monitor Anesthesia Care | Site: Knee | Laterality: Left

## 2018-08-02 MED ORDER — AMLODIPINE BESYLATE 5 MG PO TABS
5.0000 mg | ORAL_TABLET | Freq: Every day | ORAL | Status: DC
  Administered 2018-08-03 – 2018-08-05 (×3): 5 mg via ORAL
  Filled 2018-08-02 (×4): qty 1

## 2018-08-02 MED ORDER — HYDROCODONE-ACETAMINOPHEN 10-325 MG PO TABS: 1.0000 | ORAL_TABLET | Freq: Four times a day (QID) | ORAL | 0 refills | Status: DC | PRN

## 2018-08-02 MED ORDER — PROPOFOL 500 MG/50ML IV EMUL
INTRAVENOUS | Status: DC | PRN
  Administered 2018-08-02: 09:00:00 via INTRAVENOUS
  Administered 2018-08-02: 75 ug/kg/min via INTRAVENOUS

## 2018-08-02 MED ORDER — ACETAMINOPHEN 325 MG PO TABS: 325.0000 mg | ORAL_TABLET | Freq: Four times a day (QID) | ORAL | Status: DC | PRN

## 2018-08-02 MED ORDER — DIPHENHYDRAMINE HCL 12.5 MG/5ML PO ELIX: 12.5000 mg | ORAL_SOLUTION | ORAL | Status: DC | PRN

## 2018-08-02 MED ORDER — DEXAMETHASONE SODIUM PHOSPHATE 10 MG/ML IJ SOLN
10.0000 mg | Freq: Once | INTRAMUSCULAR | Status: AC
  Administered 2018-08-03: 10 mg via INTRAVENOUS
  Filled 2018-08-02: qty 1

## 2018-08-02 MED ORDER — LACTATED RINGERS IV SOLN
INTRAVENOUS | Status: DC | PRN
  Administered 2018-08-02 (×2): via INTRAVENOUS

## 2018-08-02 MED ORDER — HYDROCHLOROTHIAZIDE 25 MG PO TABS
25.0000 mg | ORAL_TABLET | Freq: Every day | ORAL | Status: DC
  Administered 2018-08-02 – 2018-08-05 (×4): 25 mg via ORAL
  Filled 2018-08-02 (×4): qty 1

## 2018-08-02 MED ORDER — FENTANYL CITRATE (PF) 250 MCG/5ML IJ SOLN
INTRAMUSCULAR | Status: AC
  Filled 2018-08-02: qty 5

## 2018-08-02 MED ORDER — MELOXICAM 15 MG PO TABS: 15.0000 mg | ORAL_TABLET | Freq: Every day | ORAL | 1 refills | Status: DC

## 2018-08-02 MED ORDER — SENNA-DOCUSATE SODIUM 8.6-50 MG PO TABS: 2.0000 | ORAL_TABLET | Freq: Every day | ORAL | 1 refills | Status: DC

## 2018-08-02 MED ORDER — ROPIVACAINE HCL 7.5 MG/ML IJ SOLN
INTRAMUSCULAR | Status: DC | PRN
  Administered 2018-08-02: 20 mL via PERINEURAL

## 2018-08-02 MED ORDER — KETOROLAC TROMETHAMINE 30 MG/ML IJ SOLN: 30.0000 mg | Freq: Once | INTRAMUSCULAR | Status: AC

## 2018-08-02 MED ORDER — ACETAMINOPHEN 10 MG/ML IV SOLN
INTRAVENOUS | Status: AC
  Filled 2018-08-02: qty 100

## 2018-08-02 MED ORDER — DOCUSATE SODIUM 100 MG PO CAPS
100.0000 mg | ORAL_CAPSULE | Freq: Two times a day (BID) | ORAL | Status: DC
  Administered 2018-08-02 – 2018-08-05 (×7): 100 mg via ORAL
  Filled 2018-08-02 (×7): qty 1

## 2018-08-02 MED ORDER — PHENOL 1.4 % MT LIQD: 1.0000 | OROMUCOSAL | Status: DC | PRN

## 2018-08-02 MED ORDER — PANTOPRAZOLE SODIUM 40 MG PO TBEC
80.0000 mg | DELAYED_RELEASE_TABLET | Freq: Every day | ORAL | Status: DC
  Administered 2018-08-03 – 2018-08-05 (×3): 80 mg via ORAL
  Filled 2018-08-02 (×4): qty 2

## 2018-08-02 MED ORDER — METOCLOPRAMIDE HCL 5 MG PO TABS: 5.0000 mg | ORAL_TABLET | Freq: Three times a day (TID) | ORAL | Status: DC | PRN

## 2018-08-02 MED ORDER — HYDROMORPHONE HCL 1 MG/ML IJ SOLN
0.2500 mg | INTRAMUSCULAR | Status: DC | PRN
  Administered 2018-08-02 (×4): 0.5 mg via INTRAVENOUS

## 2018-08-02 MED ORDER — MORPHINE SULFATE (PF) 2 MG/ML IV SOLN
0.5000 mg | INTRAVENOUS | Status: DC | PRN
  Administered 2018-08-02: 1 mg via INTRAVENOUS
  Filled 2018-08-02: qty 1

## 2018-08-02 MED ORDER — ONDANSETRON HCL 4 MG PO TABS: 4.0000 mg | ORAL_TABLET | Freq: Three times a day (TID) | ORAL | 0 refills | Status: DC | PRN

## 2018-08-02 MED ORDER — HYDROMORPHONE HCL 1 MG/ML IJ SOLN
INTRAMUSCULAR | Status: AC
  Filled 2018-08-02: qty 1

## 2018-08-02 MED ORDER — ONDANSETRON HCL 4 MG/2ML IJ SOLN
INTRAMUSCULAR | Status: DC | PRN
  Administered 2018-08-02: 4 mg via INTRAVENOUS

## 2018-08-02 MED ORDER — METHOCARBAMOL 1000 MG/10ML IJ SOLN
500.0000 mg | Freq: Four times a day (QID) | INTRAVENOUS | Status: DC | PRN
  Filled 2018-08-02: qty 5

## 2018-08-02 MED ORDER — HYDROMORPHONE HCL 1 MG/ML IJ SOLN
0.5000 mg | INTRAMUSCULAR | Status: DC | PRN
  Administered 2018-08-02: 0.5 mg via INTRAVENOUS

## 2018-08-02 MED ORDER — BISACODYL 10 MG RE SUPP: 10.0000 mg | Freq: Every day | RECTAL | Status: DC | PRN

## 2018-08-02 MED ORDER — MIDAZOLAM HCL 5 MG/5ML IJ SOLN
INTRAMUSCULAR | Status: DC | PRN
  Administered 2018-08-02 (×2): 1 mg via INTRAVENOUS

## 2018-08-02 MED ORDER — MIDAZOLAM HCL 2 MG/2ML IJ SOLN
INTRAMUSCULAR | Status: AC
  Filled 2018-08-02: qty 2

## 2018-08-02 MED ORDER — SODIUM CHLORIDE 0.9 % IR SOLN
Status: DC | PRN
  Administered 2018-08-02: 1000 mL

## 2018-08-02 MED ORDER — METOCLOPRAMIDE HCL 5 MG/ML IJ SOLN: 5.0000 mg | Freq: Three times a day (TID) | INTRAMUSCULAR | Status: DC | PRN

## 2018-08-02 MED ORDER — ACETAMINOPHEN 10 MG/ML IV SOLN
1000.0000 mg | Freq: Once | INTRAVENOUS | Status: AC
  Administered 2018-08-02: 1000 mg via INTRAVENOUS

## 2018-08-02 MED ORDER — PHENYLEPHRINE 40 MCG/ML (10ML) SYRINGE FOR IV PUSH (FOR BLOOD PRESSURE SUPPORT)
PREFILLED_SYRINGE | INTRAVENOUS | Status: DC | PRN
  Administered 2018-08-02 (×3): 40 ug via INTRAVENOUS

## 2018-08-02 MED ORDER — TRANEXAMIC ACID 1000 MG/10ML IV SOLN
1000.0000 mg | Freq: Once | INTRAVENOUS | Status: AC
  Administered 2018-08-02: 1000 mg via INTRAVENOUS
  Filled 2018-08-02: qty 10

## 2018-08-02 MED ORDER — FENTANYL CITRATE (PF) 100 MCG/2ML IJ SOLN
INTRAMUSCULAR | Status: DC | PRN
  Administered 2018-08-02: 50 ug via INTRAVENOUS

## 2018-08-02 MED ORDER — ACETAMINOPHEN 500 MG PO TABS
500.0000 mg | ORAL_TABLET | Freq: Four times a day (QID) | ORAL | Status: AC
  Administered 2018-08-02 – 2018-08-03 (×4): 500 mg via ORAL
  Filled 2018-08-02 (×4): qty 1

## 2018-08-02 MED ORDER — SODIUM CHLORIDE 0.9 % IV SOLN
INTRAVENOUS | Status: DC | PRN
  Administered 2018-08-02: 20 ug/min via INTRAVENOUS

## 2018-08-02 MED ORDER — ALUM & MAG HYDROXIDE-SIMETH 200-200-20 MG/5ML PO SUSP: 30.0000 mL | ORAL | Status: DC | PRN

## 2018-08-02 MED ORDER — METHOCARBAMOL 500 MG PO TABS
ORAL_TABLET | ORAL | Status: AC
  Filled 2018-08-02: qty 1

## 2018-08-02 MED ORDER — PHENYLEPHRINE 40 MCG/ML (10ML) SYRINGE FOR IV PUSH (FOR BLOOD PRESSURE SUPPORT)
PREFILLED_SYRINGE | INTRAVENOUS | Status: AC
  Filled 2018-08-02: qty 10

## 2018-08-02 MED ORDER — KETOROLAC TROMETHAMINE 15 MG/ML IJ SOLN
7.5000 mg | Freq: Four times a day (QID) | INTRAMUSCULAR | Status: AC
  Administered 2018-08-02 – 2018-08-03 (×4): 7.5 mg via INTRAVENOUS
  Filled 2018-08-02 (×4): qty 1

## 2018-08-02 MED ORDER — ONDANSETRON HCL 4 MG/2ML IJ SOLN
4.0000 mg | Freq: Four times a day (QID) | INTRAMUSCULAR | Status: DC | PRN
  Administered 2018-08-02: 4 mg via INTRAVENOUS
  Filled 2018-08-02: qty 2

## 2018-08-02 MED ORDER — CHLORHEXIDINE GLUCONATE 4 % EX LIQD: 60.0000 mL | Freq: Once | CUTANEOUS | Status: DC

## 2018-08-02 MED ORDER — POTASSIUM CHLORIDE IN NACL 20-0.45 MEQ/L-% IV SOLN
INTRAVENOUS | Status: DC
  Administered 2018-08-02 – 2018-08-03 (×2): via INTRAVENOUS
  Filled 2018-08-02 (×3): qty 1000

## 2018-08-02 MED ORDER — CEFAZOLIN SODIUM-DEXTROSE 2-4 GM/100ML-% IV SOLN
2.0000 g | Freq: Four times a day (QID) | INTRAVENOUS | Status: AC
  Administered 2018-08-02 (×2): 2 g via INTRAVENOUS
  Filled 2018-08-02 (×3): qty 100

## 2018-08-02 MED ORDER — METHOCARBAMOL 500 MG PO TABS
500.0000 mg | ORAL_TABLET | Freq: Four times a day (QID) | ORAL | Status: DC | PRN
  Administered 2018-08-02 – 2018-08-05 (×8): 500 mg via ORAL
  Filled 2018-08-02 (×7): qty 1

## 2018-08-02 MED ORDER — LOSARTAN POTASSIUM 25 MG PO TABS
25.0000 mg | ORAL_TABLET | Freq: Every day | ORAL | Status: DC
  Administered 2018-08-02 – 2018-08-05 (×4): 25 mg via ORAL
  Filled 2018-08-02 (×4): qty 1

## 2018-08-02 MED ORDER — ZOLPIDEM TARTRATE 5 MG PO TABS: 5.0000 mg | ORAL_TABLET | Freq: Every evening | ORAL | Status: DC | PRN

## 2018-08-02 MED ORDER — ONDANSETRON HCL 4 MG PO TABS: 4.0000 mg | ORAL_TABLET | Freq: Four times a day (QID) | ORAL | Status: DC | PRN

## 2018-08-02 MED ORDER — 0.9 % SODIUM CHLORIDE (POUR BTL) OPTIME
TOPICAL | Status: DC | PRN
  Administered 2018-08-02: 1000 mL

## 2018-08-02 MED ORDER — POLYETHYLENE GLYCOL 3350 17 G PO PACK: 17.0000 g | PACK | Freq: Every day | ORAL | Status: DC | PRN

## 2018-08-02 MED ORDER — ONDANSETRON HCL 4 MG/2ML IJ SOLN
INTRAMUSCULAR | Status: AC
  Filled 2018-08-02: qty 2

## 2018-08-02 MED ORDER — ASPIRIN EC 325 MG PO TBEC
325.0000 mg | DELAYED_RELEASE_TABLET | Freq: Two times a day (BID) | ORAL | Status: DC
  Administered 2018-08-02 – 2018-08-05 (×6): 325 mg via ORAL
  Filled 2018-08-02 (×7): qty 1

## 2018-08-02 MED ORDER — CEFAZOLIN SODIUM-DEXTROSE 2-4 GM/100ML-% IV SOLN
2.0000 g | INTRAVENOUS | Status: AC
  Administered 2018-08-02: 2 g via INTRAVENOUS
  Filled 2018-08-02: qty 100

## 2018-08-02 MED ORDER — BUPIVACAINE IN DEXTROSE 0.75-8.25 % IT SOLN
INTRATHECAL | Status: DC | PRN
  Administered 2018-08-02: 1.8 mL via INTRATHECAL

## 2018-08-02 MED ORDER — MENTHOL 3 MG MT LOZG: 1.0000 | LOZENGE | OROMUCOSAL | Status: DC | PRN

## 2018-08-02 MED ORDER — METHOCARBAMOL 750 MG PO TABS: 750.0000 mg | ORAL_TABLET | Freq: Three times a day (TID) | ORAL | 2 refills | Status: DC | PRN

## 2018-08-02 MED ORDER — HYDROCODONE-ACETAMINOPHEN 7.5-325 MG PO TABS
1.0000 | ORAL_TABLET | ORAL | Status: DC | PRN
  Administered 2018-08-02 – 2018-08-04 (×5): 2 via ORAL
  Filled 2018-08-02 (×5): qty 2

## 2018-08-02 MED ORDER — HYDROCODONE-ACETAMINOPHEN 5-325 MG PO TABS
1.0000 | ORAL_TABLET | ORAL | Status: DC | PRN
  Administered 2018-08-02 – 2018-08-05 (×8): 2 via ORAL
  Filled 2018-08-02 (×8): qty 2

## 2018-08-02 MED ORDER — METFORMIN HCL ER 500 MG PO TB24
500.0000 mg | ORAL_TABLET | Freq: Every day | ORAL | Status: DC
  Administered 2018-08-02 – 2018-08-05 (×4): 500 mg via ORAL
  Filled 2018-08-02 (×4): qty 1

## 2018-08-02 MED ORDER — MAGNESIUM CITRATE PO SOLN: 1.0000 | Freq: Once | ORAL | Status: DC | PRN

## 2018-08-02 MED ORDER — PROPOFOL 10 MG/ML IV BOLUS
INTRAVENOUS | Status: AC
  Filled 2018-08-02: qty 20

## 2018-08-02 MED ORDER — KETOROLAC TROMETHAMINE 30 MG/ML IJ SOLN
INTRAMUSCULAR | Status: AC
  Administered 2018-08-02: 30 mg
  Filled 2018-08-02: qty 1

## 2018-08-02 SURGICAL SUPPLY — 63 items
BANDAGE ACE 6X5 VEL STRL LF (GAUZE/BANDAGES/DRESSINGS) ×3 IMPLANT
BANDAGE ESMARK 6X9 LF (GAUZE/BANDAGES/DRESSINGS) ×1 IMPLANT
BEARING TIBIAL PS 10X71/75 (Joint) ×2 IMPLANT
BEARING TIBIAL PS 10X71/75MM (Joint) ×1 IMPLANT
BLADE SAG 18X100X1.27 (BLADE) ×6 IMPLANT
BLADE SURG 10 STRL SS (BLADE) ×6 IMPLANT
BNDG ELASTIC 6X10 VLCR STRL LF (GAUZE/BANDAGES/DRESSINGS) ×3 IMPLANT
BNDG ESMARK 6X9 LF (GAUZE/BANDAGES/DRESSINGS) ×3
BOWL SMART MIX CTS (DISPOSABLE) ×3 IMPLANT
CEMENT BONE R 1X40 (Cement) ×6 IMPLANT
CLOSURE STERI-STRIP 1/2X4 (GAUZE/BANDAGES/DRESSINGS) ×1
CLSR STERI-STRIP ANTIMIC 1/2X4 (GAUZE/BANDAGES/DRESSINGS) ×2 IMPLANT
COMP FEM VG IL 70 LT (Knees) ×3 IMPLANT
COMPONENT FEM VG IL 70 LT (Knees) ×1 IMPLANT
COVER SURGICAL LIGHT HANDLE (MISCELLANEOUS) ×3 IMPLANT
CUFF TOURNIQUET SINGLE 34IN LL (TOURNIQUET CUFF) ×3 IMPLANT
DISTAL FEMORAL PEG (Knees) ×3 IMPLANT
DRAPE EXTREMITY T 121X128X90 (DRAPE) ×3 IMPLANT
DRAPE HALF SHEET 40X57 (DRAPES) ×3 IMPLANT
DRAPE U-SHAPE 47X51 STRL (DRAPES) ×3 IMPLANT
DRSG AQUACEL AG ADV 3.5X10 (GAUZE/BANDAGES/DRESSINGS) ×3 IMPLANT
DRSG MEPILEX BORDER 4X8 (GAUZE/BANDAGES/DRESSINGS) ×3 IMPLANT
DURAPREP 26ML APPLICATOR (WOUND CARE) ×6 IMPLANT
ELECT CAUTERY BLADE 6.4 (BLADE) ×3 IMPLANT
ELECT REM PT RETURN 9FT ADLT (ELECTROSURGICAL) ×3
ELECTRODE REM PT RTRN 9FT ADLT (ELECTROSURGICAL) ×1 IMPLANT
GLOVE BIOGEL PI IND STRL 6.5 (GLOVE) ×3 IMPLANT
GLOVE BIOGEL PI IND STRL 7.0 (GLOVE) ×3 IMPLANT
GLOVE BIOGEL PI INDICATOR 6.5 (GLOVE) ×6
GLOVE BIOGEL PI INDICATOR 7.0 (GLOVE) ×6
GLOVE BIOGEL PI ORTHO PRO SZ8 (GLOVE) ×4
GLOVE ORTHO TXT STRL SZ7.5 (GLOVE) ×3 IMPLANT
GLOVE PI ORTHO PRO STRL SZ8 (GLOVE) ×2 IMPLANT
GLOVE SURG ORTHO 8.0 STRL STRW (GLOVE) ×3 IMPLANT
GOWN STRL REUS W/ TWL XL LVL3 (GOWN DISPOSABLE) ×1 IMPLANT
GOWN STRL REUS W/TWL 2XL LVL3 (GOWN DISPOSABLE) ×3 IMPLANT
GOWN STRL REUS W/TWL XL LVL3 (GOWN DISPOSABLE) ×2
HANDPIECE INTERPULSE COAX TIP (DISPOSABLE) ×2
HOOD PEEL AWAY FACE SHEILD DIS (HOOD) ×3 IMPLANT
HOOD PEEL AWAY FLYTE STAYCOOL (MISCELLANEOUS) ×3 IMPLANT
IMMOBILIZER KNEE 22 (SOFTGOODS) ×3 IMPLANT
KIT BASIN OR (CUSTOM PROCEDURE TRAY) ×3 IMPLANT
KIT TURNOVER KIT B (KITS) ×3 IMPLANT
MANIFOLD NEPTUNE II (INSTRUMENTS) ×3 IMPLANT
NEEDLE 18GX1X1/2 (RX/OR ONLY) (NEEDLE) ×3 IMPLANT
NS IRRIG 1000ML POUR BTL (IV SOLUTION) ×3 IMPLANT
PACK TOTAL JOINT (CUSTOM PROCEDURE TRAY) ×3 IMPLANT
PAD ARMBOARD 7.5X6 YLW CONV (MISCELLANEOUS) ×6 IMPLANT
PATELLA STD 34X8.5 (Orthopedic Implant) ×3 IMPLANT
PEG FEMORAL DISTAL (Knees) ×1 IMPLANT
PLATE KNEE TIBIAL 71MM FIXED (Plate) ×3 IMPLANT
SET HNDPC FAN SPRY TIP SCT (DISPOSABLE) ×1 IMPLANT
SUCTION FRAZIER HANDLE 10FR (MISCELLANEOUS) ×2
SUCTION TUBE FRAZIER 10FR DISP (MISCELLANEOUS) ×1 IMPLANT
SUT VIC AB 0 CT1 27 (SUTURE) ×2
SUT VIC AB 0 CT1 27XBRD ANBCTR (SUTURE) ×1 IMPLANT
SUT VIC AB 2-0 CT1 27 (SUTURE) ×2
SUT VIC AB 2-0 CT1 TAPERPNT 27 (SUTURE) ×1 IMPLANT
SUT VIC AB 3-0 SH 8-18 (SUTURE) ×6 IMPLANT
SYR CONTROL 10ML LL (SYRINGE) ×3 IMPLANT
TOWEL OR 17X26 10 PK STRL BLUE (TOWEL DISPOSABLE) ×3 IMPLANT
TRAY FOLEY CATH 14FR (SET/KITS/TRAYS/PACK) ×3 IMPLANT
YANKAUER SUCT BULB TIP NO VENT (SUCTIONS) ×3 IMPLANT

## 2018-08-02 NOTE — Discharge Instructions (Signed)

## 2018-08-02 NOTE — Anesthesia Postprocedure Evaluation (Signed)
Anesthesia Post Note  Patient: Donna Casey  Procedure(s) Performed: TOTAL KNEE ARTHROPLASTY (Left Knee)     Patient location during evaluation: PACU Anesthesia Type: Regional and Spinal Level of consciousness: oriented and awake and alert Pain management: pain level controlled Vital Signs Assessment: post-procedure vital signs reviewed and stable Respiratory status: spontaneous breathing, respiratory function stable and patient connected to nasal cannula oxygen Cardiovascular status: blood pressure returned to baseline and stable Postop Assessment: no headache, no backache, no apparent nausea or vomiting, spinal receding and patient able to bend at knees Anesthetic complications: no    Last Vitals:  Vitals:   08/02/18 1215 08/02/18 1224  BP: 119/87 119/86  Pulse: 79 79  Resp: 20 14  Temp:  (!) 36.3 C  SpO2: 99% 97%    Last Pain:  Vitals:   08/02/18 1215  TempSrc:   PainSc: Asleep                 Nydia Ytuarte,W. EDMOND

## 2018-08-02 NOTE — Anesthesia Procedure Notes (Signed)
Procedure Name: MAC Date/Time: 08/02/2018 7:51 AM Performed by: Candis Shine, CRNA Pre-anesthesia Checklist: Patient identified, Emergency Drugs available, Patient being monitored, Suction available and Timeout performed Patient Re-evaluated:Patient Re-evaluated prior to induction Oxygen Delivery Method: Simple face mask Dental Injury: Teeth and Oropharynx as per pre-operative assessment

## 2018-08-02 NOTE — Evaluation (Signed)
Physical Therapy Evaluation Patient Details Name: Donna Casey MRN: [id number] DOB: 26-Jan-1961 Today's Date: 08/02/2018   History of Present Illness  Pt is a 57 y/o female s/p L TKA. PMH includes DM, GERD, HTN, R THA, and cervical surgery.   Clinical Impression  Pt is s/p surgery above with deficits below. Pt very limited by pain and extremely slow and guarded during bed mobility. REquired mod A to sit at EOB. Pt wanting to sit for extended time at EOB, however, pt refusing further mobility. Educated about supine HEP and knee precautions. Anticipate pt will progress well once pain controlled. Will continue to follow acutely to maximize functional mobility independence and safety.     Follow Up Recommendations Follow surgeon's recommendation for DC plan and follow-up therapies;Supervision for mobility/OOB    Equipment Recommendations  3in1 (PT)    Recommendations for Other Services OT consult     Precautions / Restrictions Precautions Precautions: Knee Precaution Booklet Issued: Yes (comment) Precaution Comments: Reviewed knee precautions and supine HEP.  Restrictions Weight Bearing Restrictions: Yes LLE Weight Bearing: Weight bearing as tolerated      Mobility  Bed Mobility Overal bed mobility: Needs Assistance Bed Mobility: Supine to Sit;Sit to Supine     Supine to sit: Mod assist Sit to supine: Mod assist   General bed mobility comments: Pt extremely slow and guarded during bed mobility. Required cues for sequencing and mod A for LLE management throughout. Wanted to sit at EOB for ~5-8 min, however, refusing to stand secondary to increased pain. Pt yelling out during mobility secondary to pain.   Transfers                 General transfer comment: Deferred.   Ambulation/Gait                Stairs            Wheelchair Mobility    Modified Rankin (Stroke Patients Only)       Balance Overall balance assessment: Needs  assistance Sitting-balance support: No upper extremity supported;Feet supported Sitting balance-Leahy Scale: Fair                                       Pertinent Vitals/Pain Pain Assessment: Faces Faces Pain Scale: Hurts whole lot Pain Location: L knee  Pain Descriptors / Indicators: Grimacing;Moaning;Aching;Operative site guarding Pain Intervention(s): Limited activity within patient's tolerance;Monitored during session;Repositioned    Home Living Family/patient expects to be discharged to:: Private residence Living Arrangements: Spouse/significant other Available Help at Discharge: Family;Available 24 hours/day Type of Home: House Home Access: Stairs to enter Entrance Stairs-Rails: None Entrance Stairs-Number of Steps: 2 Home Layout: One level Home Equipment: Environmental consultant - 2 wheels      Prior Function Level of Independence: Independent with assistive device(s)         Comments: USed RW for ambulation      Hand Dominance        Extremity/Trunk Assessment   Upper Extremity Assessment Upper Extremity Assessment: Defer to OT evaluation    Lower Extremity Assessment Lower Extremity Assessment: LLE deficits/detail LLE Deficits / Details: Some decreased sensation. Able to perform ther ex below. Deficits consistent with post op pain and weakness.     Cervical / Trunk Assessment Cervical / Trunk Assessment: Normal  Communication   Communication: No difficulties  Cognition Arousal/Alertness: Awake/alert Behavior During Therapy: WFL for tasks assessed/performed Overall Cognitive Status:  Within Functional Limits for tasks assessed                                        General Comments General comments (skin integrity, edema, etc.): Pt's husband present throughout session.     Exercises Total Joint Exercises Ankle Circles/Pumps: AROM;Both;20 reps Quad Sets: AROM;Left;5 reps;Supine   Assessment/Plan    PT Assessment Patient needs  continued PT services  PT Problem List Decreased strength;Decreased balance;Decreased mobility;Decreased range of motion;Decreased activity tolerance;Decreased knowledge of use of DME;Decreased knowledge of precautions;Pain       PT Treatment Interventions DME instruction;Gait training;Stair training;Functional mobility training;Therapeutic activities;Therapeutic exercise;Balance training;Patient/family education    PT Goals (Current goals can be found in the Care Plan section)  Acute Rehab PT Goals Patient Stated Goal: to decrease pain  PT Goal Formulation: With patient Time For Goal Achievement: 08/16/18 Potential to Achieve Goals: Good    Frequency 7X/week   Barriers to discharge        Co-evaluation               AM-PAC PT "6 Clicks" Daily Activity  Outcome Measure Difficulty turning over in bed (including adjusting bedclothes, sheets and blankets)?: Unable Difficulty moving from lying on back to sitting on the side of the bed? : Unable Difficulty sitting down on and standing up from a chair with arms (e.g., wheelchair, bedside commode, etc,.)?: Unable Help needed moving to and from a bed to chair (including a wheelchair)?: A Lot Help needed walking in hospital room?: A Lot Help needed climbing 3-5 steps with a railing? : Total 6 Click Score: 8    End of Session Equipment Utilized During Treatment: Gait belt Activity Tolerance: Patient limited by pain Patient left: in bed;with call bell/phone within reach;with family/visitor present Nurse Communication: Mobility status PT Visit Diagnosis: Other abnormalities of gait and mobility (R26.89);Muscle weakness (generalized) (M62.81);Unsteadiness on feet (R26.81);Pain Pain - Right/Left: Left Pain - part of body: Knee    Time: 3646-8032 PT Time Calculation (min) (ACUTE ONLY): 43 min   Charges:   PT Evaluation $PT Eval Low Complexity: 1 Low PT Treatments $Therapeutic Activity: 23-37 mins        Gladys Damme, PT, DPT  Acute Rehabilitation Services  Pager: [phone] Office: [phone]   Lehman Prom 08/02/2018, 4:44 PM

## 2018-08-02 NOTE — Transfer of Care (Signed)
Immediate Anesthesia Transfer of Care Note  Patient: Donna Casey  Procedure(s) Performed: TOTAL KNEE ARTHROPLASTY (Left Knee)  Patient Location: PACU  Anesthesia Type:Spinal and MAC combined with regional for post-op pain  Level of Consciousness: awake, alert  and oriented  Airway & Oxygen Therapy: Patient Spontanous Breathing and Patient connected to face mask oxygen  Post-op Assessment: Report given to RN and Post -op Vital signs reviewed and stable  Post vital signs: Reviewed and stable  Last Vitals:  Vitals Value Taken Time  BP 97/75 08/02/2018 10:45 AM  Temp    Pulse 90 08/02/2018 10:47 AM  Resp 18 08/02/2018 10:47 AM  SpO2 98 % 08/02/2018 10:47 AM  Vitals shown include unvalidated device data.  Last Pain:  Vitals:   08/02/18 0623  TempSrc: Oral  PainSc:          Complications: No apparent anesthesia complications

## 2018-08-02 NOTE — H&P (Signed)
PREOPERATIVE H&P  Chief Complaint: left knee pain  HPI: Donna Casey is a 57 y.o. female who presents for preoperative history and physical with a diagnosis of left knee arthritis. Symptoms are rated as moderate to severe, and have been worsening.  This is significantly impairing activities of daily living.  She has elected for surgical management.   She has failed injections, activity modification, anti-inflammatories, and assistive devices.  Preoperative X-rays demonstrate end stage degenerative changes with osteophyte formation, loss of joint space, subchondral sclerosis.   Past Medical History:  Diagnosis Date  . Anemia   . Arthritis   . Diabetes mellitus without complication (HCC)   . Enlarged liver   . Family history of adverse reaction to anesthesia    N/V  . Fatty liver   . GERD (gastroesophageal reflux disease)   . H pylori ulcer   . Heart palpitations   . Hypertension   . Numbness and tingling in hands    right hand  . PONV (postoperative nausea and vomiting)    Past Surgical History:  Procedure Laterality Date  . BACK SURGERY     disk removal  . CERVICAL FUSION    . COLONOSCOPY    . DILITATION & CURRETTAGE/HYSTROSCOPY WITH NOVASURE ABLATION N/A 07/14/2016   Procedure: DILATATION & CURETTAGE/HYSTEROSCOPY WITH NOVASURE ABLATION;  Surgeon: Levi Aland, MD;  Location: [facility];  Service: Gynecology;  Laterality: N/A;  . MOUTH SURGERY    . ROTATOR CUFF REPAIR     July 2018  . TOTAL HIP ARTHROPLASTY Right   . TUBAL LIGATION     Social History   Socioeconomic History  . Marital status: Married    Spouse name: Not on file  . Number of children: Not on file  . Years of education: Not on file  . Highest education level: Not on file  Occupational History  . Not on file  Social Needs  . Financial resource strain: Not on file  . Food insecurity:    Worry: Not on file    Inability: Not on file  . Transportation needs:    Medical: Not on file     Non-medical: Not on file  Tobacco Use  . Smoking status: Never Smoker  . Smokeless tobacco: Never Used  Substance and Sexual Activity  . Alcohol use: No  . Drug use: No  . Sexual activity: Not on file  Lifestyle  . Physical activity:    Days per week: Not on file    Minutes per session: Not on file  . Stress: Not on file  Relationships  . Social connections:    Talks on phone: Not on file    Gets together: Not on file    Attends religious service: Not on file    Active member of club or organization: Not on file    Attends meetings of clubs or organizations: Not on file    Relationship status: Not on file  Other Topics Concern  . Not on file  Social History Narrative  . Not on file   Family History  Problem Relation Age of Onset  . Neuropathy Neg Hx    Allergies  Allergen Reactions  . Oxycodone Nausea And Vomiting   Prior to Admission medications   Medication Sig Start Date End Date Taking? Authorizing Provider  amLODipine (NORVASC) 5 MG tablet Take 5 mg by mouth daily. 06/02/16  Yes [provider]  hydrochlorothiazide (HYDRODIURIL) 25 MG tablet Take 25 mg by mouth daily. 06/12/16  Yes  [provider]  losartan (COZAAR) 25 MG tablet Take 25 mg by mouth daily.   Yes [provider]  meloxicam (MOBIC) 15 MG tablet Take 15 mg by mouth daily.   Yes [provider]  metFORMIN (GLUCOPHAGE-XR) 500 MG 24 hr tablet Take 500 mg by mouth daily.   Yes [provider]  omeprazole (PRILOSEC) 20 MG capsule Take 20 mg by mouth daily. 06/19/16  Yes [provider]  methocarbamol (ROBAXIN) 750 MG tablet Take 750 mg by mouth every 8 (eight) hours as needed for muscle spasms.  02/03/17   [provider]     Positive ROS: All other systems have been reviewed and were otherwise negative with the exception of those mentioned in the HPI and as above.  Physical Exam: General: Alert, no acute distress Cardiovascular: No pedal  edema Respiratory: No cyanosis, no use of accessory musculature GI: No organomegaly, abdomen is soft and non-tender Skin: No lesions in the area of chief complaint Neurologic: Sensation intact distally Psychiatric: Patient is competent for consent with normal mood and affect Lymphatic: No axillary or cervical lymphadenopathy  MUSCULOSKELETAL: left knee with painful arc of motion 3-120 with mild effusion  Assessment: Left knee osteoarthritis   Plan: Plan for Procedure(s): TOTAL KNEE ARTHROPLASTY  The risks benefits and alternatives were discussed with the patient including but not limited to the risks of nonoperative treatment, versus surgical intervention including infection, bleeding, nerve injury,  blood clots, cardiopulmonary complications, morbidity, mortality, among others, and they were willing to proceed.   Anticipated LOS equal to or greater than 2 midnights due to - Age 26 and older with one or more of the following:  - Obesity  - Expected need for hospital services (PT, OT, Nursing) required for safe  discharge  - Anticipated need for postoperative skilled nursing care or inpatient rehab  - Active co-morbidities: Anemia and Advanced Liver Disease   Preoperative templating of the joint replacement has been completed, documented, and submitted to the Operating Room personnel in order to optimize intra-operative equipment management.  Eulas Post, MD Cell [phone]   08/02/2018 7:21 AM

## 2018-08-02 NOTE — Op Note (Signed)
DATE OF SURGERY:  08/02/2018 TIME: 10:08 AM  PATIENT NAME:  Donna Casey   AGE: 57 y.o.    PRE-OPERATIVE DIAGNOSIS:    POST-OPERATIVE DIAGNOSIS:  Same  PROCEDURE:  Total Knee Arthroplasty  SURGEON:  Eulas Post, MD   ASSISTANT:  Janace Litten, OPA-C, present and scrubbed throughout the case, critical for assistance with exposure, retraction, instrumentation, and closure.   OPERATIVE IMPLANTS: Biomet Vanguard Fixed Bearing Posterior Stabilized Femur size 70, Tibia size 71, Patella size 34 3-peg oval button, with a 10 mm polyethylene insert.   PREOPERATIVE INDICATIONS:  Donna Casey is a 57 y.o. year old female with end stage bone on bone degenerative arthritis of the knee who failed conservative treatment, including injections, antiinflammatories, activity modification, and assistive devices, and had significant impairment of their activities of daily living, and elected for Total Knee Arthroplasty.   The risks, benefits, and alternatives were discussed at length including but not limited to the risks of infection, bleeding, nerve injury, stiffness, blood clots, the need for revision surgery, cardiopulmonary complications, among others, and they were willing to proceed.  OPERATIVE FINDINGS AND UNIQUE ASPECTS OF THE CASE:  She had extensive grade 4 disease on the lateral compartment and patella and femoral trochlea.  I had to cut the femur twice, and then went back and cut the tibia twice as well.  It was difficult to get the trial poly in place.  I cut the femur in 3 degrees external rotation with 5 degrees distal femoral cut at 9 and then again at 11.  Ultimately I had excellent balance and full motion.  The patella had severe lateral wear.  It measured 22 but was very thin laterally and the cut didn't go all the way across.  It measured just under 14 after the cut and I was reluctant to take more.    ESTIMATED BLOOD LOSS: 100 ml  OPERATIVE DESCRIPTION:  The patient was  brought to the operative room and placed in a supine position.  Spinal anesthesia was administered, along with a foley and an adductor block.  IV antibiotics were given.  The lower extremity was prepped and draped in the usual sterile fashion.  Time out was performed.  The leg was elevated and exsanguinated and the tourniquet was inflated.  Anterior quadriceps tendon splitting approach was performed.  The patella was everted and osteophytes were removed.  The anterior horn of the medial and lateral meniscus was removed.   The distal femur was opened with the drill and the intramedullary distal femoral cutting jig was utilized, set at 5 degrees resecting 9 mm off the distal femur.  Care was taken to protect the collateral ligaments.  Then the extramedullary tibial cutting jig was utilized making the appropriate cut using the anterior tibial crest as a reference building in appropriate posterior slope.  It was cut at neutral both on the slope and on the coronal alignment.  Care was taken during the cut to protect the medial and collateral ligaments.  The proximal tibia was removed along with the posterior horns of the menisci.  The PCL was sacrificed.    The extensor gap was measured and was approximately 34mm, although it was slightly tight, but I moved forward at this point.    The distal femoral sizing jig was applied, taking care to avoid notching.  Then the 4-in-1 cutting jig was applied and the anterior and posterior femur was cut, along with the chamfer cuts.  All posterior osteophytes were  removed.  The flexion gap was then measured and was symmetric with the extension gap, slightly tighter than the 10.  I thought it would loosen up as we went forward.  I completed the distal femoral preparation using the appropriate jig to prepare the box.  The proximal tibia sized and prepared accordingly with the reamer and the punch, and then all components were trialed with the 10mm poly insert. I couldn't  get the poly in, either in flexion nor in extension, so I went back and recut two more off the tibia and reprepared the tibia with the punch.  I then placed the trials and the knee was found to have excellent balance and full motion.    The patella was then measured, and cut with the saw.  The thickness before the cut was 22 and after the cut was 14.  The above named components were then cemented into place and all excess cement was removed.  The real polyethylene implant was placed.  After the cement had cured I released the tourniquet and confirmed excellent hemostasis with no major posterior vessel injury.    The knee was easily taken through a range of motion and the patella tracked well and the knee irrigated copiously and the parapatellar and subcutaneous tissue closed with vicryl, and monocryl with steri strips for the skin.  The wounds were injected with marcaine, and dressed with sterile gauze and the patient was awakened and returned to the PACU in stable and satisfactory condition.  There were no complications.  Total tourniquet time was 114 minutes.

## 2018-08-02 NOTE — Progress Notes (Signed)
PT Cancellation Note  Patient Details Name: Donna Casey MRN: [id number] DOB: 11-06-1961   Cancelled Treatment:    Reason Eval/Treat Not Completed: Medical issues which prohibited therapy Pt with nausea and requesting to eat. Will follow up as schedule allows.   Gladys Damme, PT, DPT  Acute Rehabilitation Services  Pager: [phone]  Lehman Prom 08/02/2018, 2:41 PM

## 2018-08-02 NOTE — Anesthesia Procedure Notes (Signed)
Anesthesia Regional Block: Adductor canal block   Pre-Anesthetic Checklist: ,, timeout performed, Correct Patient, Correct Site, Correct Laterality, Correct Procedure, Correct Position, site marked, Risks and benefits discussed,  Surgical consent,  Pre-op evaluation,  At surgeon's request and post-op pain management  Laterality: Left  Prep: chloraprep       Needles:  Injection technique: Single-shot  Needle Type: Echogenic Needle     Needle Length: 9cm  Needle Gauge: 21     Additional Needles:   Procedures:,,,, ultrasound used (permanent image in chart),,,,  Narrative:  Start time: 08/02/2018 7:25 AM End time: 08/02/2018 7:30 AM Injection made incrementally with aspirations every 5 mL.  Performed by: Personally  Anesthesiologist: Cecile Hearing, MD  Additional Notes: No pain on injection. No increased resistance to injection. Injection made in 5cc increments.  Good needle visualization.  Patient tolerated procedure well.

## 2018-08-02 NOTE — Anesthesia Preprocedure Evaluation (Signed)
Anesthesia Evaluation  Patient identified by MRN, date of birth, ID band Patient awake    Reviewed: Allergy & Precautions, NPO status , Patient's Chart, lab work & pertinent test results  History of Anesthesia Complications (+) PONV and history of anesthetic complications  Airway Mallampati: I  TM Distance: >3 FB Neck ROM: Full    Dental  (+) Dental Advisory Given, Teeth Intact   Pulmonary neg pulmonary ROS,    breath sounds clear to auscultation       Cardiovascular hypertension, Pt. on medications (-) angina(-) Past MI, (-) Cardiac Stents and (-) CABG + dysrhythmias (palpitations)  Rhythm:Regular Rate:Normal     Neuro/Psych neg Seizures Numbness and tingling in right hand   Neuromuscular disease    GI/Hepatic PUD, GERD  Medicated and Controlled,Hepatomegaly   Endo/Other  diabetes  Renal/GU negative Renal ROS     Musculoskeletal  (+) Arthritis ,   Abdominal (+) + obese,   Peds  Hematology  (+) Blood dyscrasia, anemia ,   Anesthesia Other Findings   Reproductive/Obstetrics                             Anesthesia Physical Anesthesia Plan  ASA: II  Anesthesia Plan: MAC, Spinal and Regional   Post-op Pain Management:    Induction: Intravenous  PONV Risk Score and Plan: 3 and Treatment may vary due to age or medical condition  Airway Management Planned: Nasal Cannula  Additional Equipment: None  Intra-op Plan:   Post-operative Plan:   Informed Consent: I have reviewed the patients History and Physical, chart, labs and discussed the procedure including the risks, benefits and alternatives for the proposed anesthesia with the patient or authorized representative who has indicated his/her understanding and acceptance.   Dental advisory given  Plan Discussed with: CRNA and Surgeon  Anesthesia Plan Comments:         Anesthesia Quick Evaluation

## 2018-08-02 NOTE — Anesthesia Procedure Notes (Signed)
Spinal  Patient location during procedure: OR Start time: 08/02/2018 7:44 AM End time: 08/02/2018 7:50 AM Staffing Anesthesiologist: Val Eagle, MD Preanesthetic Checklist Completed: patient identified, surgical consent, pre-op evaluation, timeout performed, IV checked, risks and benefits discussed and monitors and equipment checked Spinal Block Patient position: sitting Prep: site prepped and draped and DuraPrep Patient monitoring: heart rate, cardiac monitor, continuous pulse ox and blood pressure Approach: midline Location: L3-4 Injection technique: single-shot Needle Needle type: Pencan  Needle gauge: 24 G Needle length: 10 cm Assessment Sensory level: T6

## 2018-08-02 NOTE — Plan of Care (Signed)

## 2018-08-03 ENCOUNTER — Encounter (HOSPITAL_COMMUNITY): Payer: Self-pay | Admitting: Orthopedic Surgery

## 2018-08-03 LAB — BASIC METABOLIC PANEL
Anion gap: 8 (ref 5–15)
BUN: 15 mg/dL (ref 6–20)
CHLORIDE: 101 mmol/L (ref 98–111)
CO2: 25 mmol/L (ref 22–32)
CREATININE: 0.99 mg/dL (ref 0.44–1.00)
Calcium: 8.7 mg/dL — ABNORMAL LOW (ref 8.9–10.3)
Glucose, Bld: 210 mg/dL — ABNORMAL HIGH (ref 70–99)
Potassium: 3.2 mmol/L — ABNORMAL LOW (ref 3.5–5.1)
Sodium: 134 mmol/L — ABNORMAL LOW (ref 135–145)

## 2018-08-03 LAB — CBC
HCT: 33.6 % — ABNORMAL LOW (ref 36.0–46.0)
HEMOGLOBIN: 10.7 g/dL — AB (ref 12.0–15.0)
MCH: 29 pg (ref 26.0–34.0)
MCHC: 31.8 g/dL (ref 30.0–36.0)
MCV: 91.1 fL (ref 78.0–100.0)
PLATELETS: 301 10*3/uL (ref 150–400)
RBC: 3.69 MIL/uL — ABNORMAL LOW (ref 3.87–5.11)
RDW: 12.2 % (ref 11.5–15.5)
WBC: 7.7 10*3/uL (ref 4.0–10.5)

## 2018-08-03 MED ORDER — INFLUENZA VAC SPLIT QUAD 0.5 ML IM SUSY
0.5000 mL | PREFILLED_SYRINGE | INTRAMUSCULAR | Status: AC
  Administered 2018-08-05: 0.5 mL via INTRAMUSCULAR
  Filled 2018-08-03: qty 0.5

## 2018-08-03 MED ORDER — ASPIRIN 325 MG PO TBEC: 325.0000 mg | DELAYED_RELEASE_TABLET | Freq: Two times a day (BID) | ORAL | 0 refills | Status: DC

## 2018-08-03 NOTE — Progress Notes (Signed)
Physical Therapy Treatment Patient Details Name: Donna Casey MRN: [id number] DOB: Jul 05, 1961 Today's Date: 08/03/2018    History of Present Illness Pt is a 57 y/o female s/p L TKA. PMH includes DM, GERD, HTN, R THA, and cervical surgery.     PT Comments    Pt making slow progress with functional mobility. She was able to complete sit<>stand x2 and stand-pivot x2 with min-mod A x2 for physical assistance. Pt very limited secondary to pain and fatigue. Pt would continue to benefit from skilled physical therapy services at this time while admitted and after d/c to address the below listed limitations in order to improve overall safety and independence with functional mobility.   Follow Up Recommendations  Home health PT;Supervision/Assistance - 24 hour;Other (comment)(hopeful that pt will progress with mobility; however, if she does not progress well will need SNF for short term rehab prior to returning home)     Equipment Recommendations  3in1 (PT)    Recommendations for Other Services OT consult     Precautions / Restrictions Precautions Precautions: Knee Precaution Comments: Reviewed knee precautions and positioning of LE following TKA surgery Restrictions Weight Bearing Restrictions: Yes LLE Weight Bearing: Weight bearing as tolerated    Mobility  Bed Mobility Overal bed mobility: Needs Assistance Bed Mobility: Supine to Sit     Supine to sit: Mod assist     General bed mobility comments: increased time and effort, cueing for sequencing, assist for L LE movement off of bed  Transfers Overall transfer level: Needs assistance Equipment used: Rolling walker (2 wheeled) Transfers: Sit to/from [employer] Sit to Stand: Mod assist;+2 safety/equipment;+2 physical assistance Stand pivot transfers: Min assist;+2 physical assistance;+2 safety/equipment       General transfer comment: increased time and effort, cueing for safe hand placement, assist of  two to power into standing from EOB x1 and from [facility] x1, assist with managing RW and cueing for technique to pivot to chair  Ambulation/Gait                 Stairs             Wheelchair Mobility    Modified Rankin (Stroke Patients Only)       Balance Overall balance assessment: Needs assistance Sitting-balance support: No upper extremity supported;Feet supported Sitting balance-Leahy Scale: Fair     Standing balance support: Bilateral upper extremity supported;During functional activity Standing balance-Leahy Scale: Poor                              Cognition Arousal/Alertness: Awake/alert Behavior During Therapy: WFL for tasks assessed/performed Overall Cognitive Status: Within Functional Limits for tasks assessed                                        Exercises      General Comments        Pertinent Vitals/Pain Pain Assessment: Faces Faces Pain Scale: Hurts whole lot Pain Location: L knee  Pain Descriptors / Indicators: Grimacing;Moaning;Aching;Operative site guarding Pain Intervention(s): Monitored during session;Repositioned    Home Living                      Prior Function            PT Goals (current goals can now be found in the care plan section) Acute  Rehab PT Goals PT Goal Formulation: With patient Time For Goal Achievement: 08/16/18 Potential to Achieve Goals: Good Progress towards PT goals: Progressing toward goals    Frequency    7X/week      PT Plan Current plan remains appropriate    Co-evaluation              AM-PAC PT "6 Clicks" Daily Activity  Outcome Measure  Difficulty turning over in bed (including adjusting bedclothes, sheets and blankets)?: Unable Difficulty moving from lying on back to sitting on the side of the bed? : Unable Difficulty sitting down on and standing up from a chair with arms (e.g., wheelchair, bedside commode, etc,.)?: Unable Help needed moving  to and from a bed to chair (including a wheelchair)?: A Lot Help needed walking in hospital room?: A Lot Help needed climbing 3-5 steps with a railing? : Total 6 Click Score: 8    End of Session Equipment Utilized During Treatment: Gait belt Activity Tolerance: Patient limited by pain Patient left: in chair;with call bell/phone within reach;with family/visitor present Nurse Communication: Mobility status PT Visit Diagnosis: Other abnormalities of gait and mobility (R26.89);Pain Pain - Right/Left: Left Pain - part of body: Knee     Time: 0037-0488 PT Time Calculation (min) (ACUTE ONLY): 26 min  Charges:  $Therapeutic Activity: 23-37 mins                     Deborah Chalk, PT, DPT  Acute Rehabilitation Services Pager [phone] Office [phone]     Alessandra Bevels Quintavius Niebuhr 08/03/2018, 1:51 PM

## 2018-08-03 NOTE — Plan of Care (Signed)
  Problem: Pain Managment: Goal: General experience of comfort will improve Outcome: Progressing   

## 2018-08-03 NOTE — Evaluation (Signed)
Occupational Therapy Evaluation Patient Details Name: Donna Casey MRN: [id number] DOB: 1961-10-30 Today's Date: 08/03/2018    History of Present Illness Pt is a 57 y/o female s/p L TKA. PMH includes DM, GERD, HTN, R THA, and cervical surgery.    Clinical Impression   PTA, pt was independent with ADL and functional mobility. She currently requires overall min assist for toilet transfers, mod assist for LB ADL, and supervision for seated UB ADL. Pt and daughter educated concerning compensatory strategies for LB ADL as well as verbally educated concerning use of 3-in-1 as a shower seat. Pt would benefit from continued OT services while admitted to improve independence and safety with ADL and functional mobility. Recommend home health OT follow-up post-acute D/C. OT will continue to follow while admitted.     Follow Up Recommendations  Home health OT;Supervision/Assistance - 24 hour    Equipment Recommendations  Other (comment);None recommended by OT(3-in-1 has been delivered to room already)    Recommendations for Other Services       Precautions / Restrictions Precautions Precautions: Knee Precaution Booklet Issued: Yes (comment) Precaution Comments: Reviewed knee precautions and positioning of LE following TKA surgery Restrictions Weight Bearing Restrictions: Yes LLE Weight Bearing: Weight bearing as tolerated      Mobility Bed Mobility Overal bed mobility: Needs Assistance Bed Mobility: Sit to Supine     Supine to sit: Min assist Sit to supine: Mod assist   General bed mobility comments: Assist to manage BLE back into bed. Min assist to manage BLE out of bed.   Transfers Overall transfer level: Needs assistance Equipment used: Rolling walker (2 wheeled) Transfers: Sit to/from Stand Sit to Stand: Min assist Stand pivot transfers: Min assist       General transfer comment: Min assist to power up with increased time and verbal cues.     Balance Overall  balance assessment: Needs assistance Sitting-balance support: No upper extremity supported;Feet supported Sitting balance-Leahy Scale: Fair     Standing balance support: Bilateral upper extremity supported;During functional activity Standing balance-Leahy Scale: Poor Standing balance comment: Relies on BUE support.                            ADL either performed or assessed with clinical judgement   ADL Overall ADL's : Needs assistance/impaired Eating/Feeding: Set up;Sitting   Grooming: Set up;Sitting   Upper Body Bathing: Supervision/ safety;Sitting   Lower Body Bathing: Moderate assistance;Sit to/from stand   Upper Body Dressing : Supervision/safety;Sitting   Lower Body Dressing: Moderate assistance;Sit to/from stand   Toilet Transfer: Minimal assistance;Stand-pivot;BSC(was able to take a few steps from EOB as well)   Toileting- Clothing Manipulation and Hygiene: Supervision/safety;Sitting/lateral lean       Functional mobility during ADLs: Minimal assistance;Rolling walker General ADL Comments: Pt limited by pain but highly motivated to participate.      Vision Patient Visual Report: No change from baseline Vision Assessment?: No apparent visual deficits     Perception     Praxis      Pertinent Vitals/Pain Pain Assessment: Faces Faces Pain Scale: Hurts whole lot Pain Location: L knee  Pain Descriptors / Indicators: Grimacing;Operative site guarding;Aching;Sore Pain Intervention(s): Limited activity within patient's tolerance;Monitored during session;Repositioned     Hand Dominance     Extremity/Trunk Assessment Upper Extremity Assessment Upper Extremity Assessment: Overall WFL for tasks assessed   Lower Extremity Assessment Lower Extremity Assessment: LLE deficits/detail LLE Deficits / Details: Decreased strength and ROM  as expected post-operatively. Diminished sensation in L foot which is new this afternoon.        Communication  Communication Communication: No difficulties   Cognition Arousal/Alertness: Awake/alert Behavior During Therapy: WFL for tasks assessed/performed Overall Cognitive Status: Within Functional Limits for tasks assessed                                     General Comments  Pt's daughter present during session.     Exercises Exercises: Total Joint Total Joint Exercises Ankle Circles/Pumps: AROM;Left;10 reps;Seated Quad Sets: AROM;Strengthening;Left;10 reps;Seated Gluteal Sets: AROM;Strengthening;Both;10 reps;Seated   Shoulder Instructions      Home Living Family/patient expects to be discharged to:: Private residence Living Arrangements: Spouse/significant other Available Help at Discharge: Family;Available 24 hours/day Type of Home: House Home Access: Stairs to enter [employer] of Steps: 2 Entrance Stairs-Rails: None Home Layout: One level     Bathroom Shower/Tub: Chief Strategy Officer: Handicapped height     Home Equipment: Environmental consultant - 2 wheels          Prior Functioning/Environment Level of Independence: Independent with assistive device(s)        Comments: Uses RW for mobility        OT Problem List: Decreased strength;Decreased range of motion;Decreased activity tolerance;Impaired balance (sitting and/or standing);Decreased safety awareness;Decreased knowledge of use of DME or AE;Decreased knowledge of precautions;Pain      OT Treatment/Interventions: Self-care/ADL training;Therapeutic exercise;Energy conservation;DME and/or AE instruction;Therapeutic activities;Patient/family education;Balance training    OT Goals(Current goals can be found in the care plan section) Acute Rehab OT Goals Patient Stated Goal: to decrease pain  OT Goal Formulation: With patient Time For Goal Achievement: 08/17/18 Potential to Achieve Goals: Good ADL Goals Pt Will Perform Grooming: with supervision;standing Pt Will Perform Lower Body  Dressing: with min guard assist;sit to/from stand Pt Will Transfer to Toilet: with supervision;ambulating;bedside commode;regular height toilet(BSC over toilet) Pt Will Perform Toileting - Clothing Manipulation and hygiene: with modified independence;sitting/lateral leans Pt Will Perform Tub/Shower Transfer: Tub transfer;3 in 1;with min assist;rolling walker;with caregiver independent in assisting;ambulating  OT Frequency: Min 2X/week   Barriers to D/C:            Co-evaluation              AM-PAC PT "6 Clicks" Daily Activity     Outcome Measure Help from another person eating meals?: None Help from another person taking care of personal grooming?: None(in sitting) Help from another person toileting, which includes using toliet, bedpan, or urinal?: A Little Help from another person bathing (including washing, rinsing, drying)?: A Lot Help from another person to put on and taking off regular upper body clothing?: None Help from another person to put on and taking off regular lower body clothing?: A Lot 6 Click Score: 19   End of Session Equipment Utilized During Treatment: Gait belt;Rolling walker Nurse Communication: Mobility status  Activity Tolerance: Patient tolerated treatment well Patient left: in bed;with call bell/phone within reach;with family/visitor present  OT Visit Diagnosis: Other abnormalities of gait and mobility (R26.89);Pain Pain - Right/Left: Left Pain - part of body: Knee                Time: 6803-2122 OT Time Calculation (min): 29 min Charges:  OT General Charges $OT Visit: 1 Visit OT Evaluation $OT Eval Moderate Complexity: 1 Mod OT Treatments $Self Care/Home Management : 8-22 mins  [facility]  Marcy Panning, OTR/L Acute Rehabilitation Services Pager [phone] Office [phone]   Doristine Section 08/03/2018, 4:49 PM

## 2018-08-03 NOTE — Progress Notes (Signed)
Physical Therapy Treatment Patient Details Name: Donna Casey MRN: [id number] DOB: 09/05/1961 Today's Date: 08/03/2018    History of Present Illness Pt is a 57 y/o female s/p L TKA. PMH includes DM, GERD, HTN, R THA, and cervical surgery.     PT Comments    Pt making steady progress with functional mobility and tolerated short distance ambulation with min guard to min A with RW. Pt remains limited secondary to pain and weakness, but hopeful that she will continue to progress well with mobility. Pt would continue to benefit from skilled physical therapy services at this time while admitted and after d/c to address the below listed limitations in order to improve overall safety and independence with functional mobility.    Follow Up Recommendations  Home health PT;Supervision/Assistance - 24 hour     Equipment Recommendations  3in1 (PT)    Recommendations for Other Services OT consult     Precautions / Restrictions Precautions Precautions: Knee Precaution Comments: Reviewed knee precautions and positioning of LE following TKA surgery Restrictions Weight Bearing Restrictions: Yes LLE Weight Bearing: Weight bearing as tolerated    Mobility  Bed Mobility Overal bed mobility: Needs Assistance Bed Mobility: Supine to Sit     Supine to sit: Mod assist     General bed mobility comments: pt OOB in recliner chair  Transfers Overall transfer level: Needs assistance Equipment used: Rolling walker (2 wheeled) Transfers: Sit to/from Stand Sit to Stand: Min assist;+2 safety/equipment;+2 physical assistance Stand pivot transfers: Min assist;+2 physical assistance;+2 safety/equipment       General transfer comment: increased time and effort, cueing for safe hand placement and technique; min A x2 to power into standing and for stability with transition  Ambulation/Gait Ambulation/Gait assistance: Min guard;Min assist Gait Distance (Feet): 20 Feet Assistive device: Rolling  walker (2 wheeled) Gait Pattern/deviations: Step-through pattern;Decreased step length - right;Decreased step length - left;Decreased stride length Gait velocity: decreased Gait velocity interpretation: <1.31 ft/sec, indicative of household ambulator General Gait Details: pt with modest instability and antaglic gait pattern; close min guard and occasional min A for stability and RW management   Stairs             Wheelchair Mobility    Modified Rankin (Stroke Patients Only)       Balance Overall balance assessment: Needs assistance Sitting-balance support: No upper extremity supported;Feet supported Sitting balance-Leahy Scale: Fair     Standing balance support: Bilateral upper extremity supported;During functional activity Standing balance-Leahy Scale: Poor                              Cognition Arousal/Alertness: Awake/alert Behavior During Therapy: WFL for tasks assessed/performed Overall Cognitive Status: Within Functional Limits for tasks assessed                                        Exercises Total Joint Exercises Ankle Circles/Pumps: AROM;Left;10 reps;Seated Quad Sets: AROM;Strengthening;Left;10 reps;Seated Gluteal Sets: AROM;Strengthening;Both;10 reps;Seated    General Comments        Pertinent Vitals/Pain Pain Assessment: Faces Faces Pain Scale: Hurts even more Pain Location: L knee  Pain Descriptors / Indicators: Grimacing;Operative site guarding;Aching;Sore Pain Intervention(s): Monitored during session;Repositioned    Home Living                      Prior Function  PT Goals (current goals can now be found in the care plan section) Acute Rehab PT Goals PT Goal Formulation: With patient Time For Goal Achievement: 08/16/18 Potential to Achieve Goals: Good Progress towards PT goals: Progressing toward goals    Frequency    7X/week      PT Plan Current plan remains appropriate     Co-evaluation              AM-PAC PT "6 Clicks" Daily Activity  Outcome Measure  Difficulty turning over in bed (including adjusting bedclothes, sheets and blankets)?: Unable Difficulty moving from lying on back to sitting on the side of the bed? : Unable Difficulty sitting down on and standing up from a chair with arms (e.g., wheelchair, bedside commode, etc,.)?: Unable Help needed moving to and from a bed to chair (including a wheelchair)?: A Little Help needed walking in hospital room?: A Little Help needed climbing 3-5 steps with a railing? : A Lot 6 Click Score: 11    End of Session Equipment Utilized During Treatment: Gait belt Activity Tolerance: Patient limited by pain Patient left: in chair;with call bell/phone within reach;with family/visitor present Nurse Communication: Mobility status PT Visit Diagnosis: Other abnormalities of gait and mobility (R26.89);Pain Pain - Right/Left: Left Pain - part of body: Knee     Time: 3491-7915 PT Time Calculation (min) (ACUTE ONLY): 23 min  Charges:  $Therapeutic Exercise: 8-22 mins $Therapeutic Activity: 8-22 mins                     Deborah Chalk, PT, DPT  Acute Rehabilitation Services Pager [phone] Office [phone]     Alessandra Bevels Tauni Sanks 08/03/2018, 3:02 PM

## 2018-08-03 NOTE — Progress Notes (Signed)
     Subjective:  Patient reports pain as moderate.  Had a difficult night from a pain standpoint.  Objective:   VITALS:   Vitals:   08/02/18 1727 08/02/18 2045 08/03/18 0012 08/03/18 0502  BP: (!) 132/102 (!) 148/71 129/88 103/63  Pulse: (!) 105 (!) 108 (!) 108 (!) 121  Resp:  20 (!) 22 20  Temp: 98.3 F (36.8 C) 98 F (36.7 C) 98.5 F (36.9 C) 99.2 F (37.3 C)  TempSrc: Oral Oral Oral Oral  SpO2: 99% 99% 99% 96%  Weight:      Height:        Neurologically intact Dorsiflexion/Plantar flexion intact Incision: dressing C/D/I   Lab Results  Component Value Date   WBC 7.7 08/03/2018   HGB 10.7 (L) 08/03/2018   HCT 33.6 (L) 08/03/2018   MCV 91.1 08/03/2018   PLT 301 08/03/2018   BMET    Component Value Date/Time   NA 134 (L) 08/03/2018 0359   NA 141 03/11/2017 0922   K 3.2 (L) 08/03/2018 0359   CL 101 08/03/2018 0359   CO2 25 08/03/2018 0359   GLUCOSE 210 (H) 08/03/2018 0359   BUN 15 08/03/2018 0359   BUN 16 03/11/2017 0922   CREATININE 0.99 08/03/2018 0359   CALCIUM 8.7 (L) 08/03/2018 0359   GFRNONAA >60 08/03/2018 0359   GFRAA >60 08/03/2018 0359     Assessment/Plan: 1 Day Post-Op   Principal Problem:   Primary localized osteoarthritis of left knee Active Problems:   S/P knee replacement   Advance diet Up with therapy Plan for discharge tomorrow Discharge home with home health Dc home thurs or fri depending on mobility.   Anticipated LOS equal to or greater than 2 midnights due to - Age 55 and older with one or more of the following:  - Obesity  - Expected need for hospital services (PT, OT, Nursing) required for safe  discharge  - Anticipated need for postoperative skilled nursing care or inpatient rehab       Eulas Post 08/03/2018, 8:19 AM   Teryl Lucy, MD Cell [phone]

## 2018-08-04 LAB — CBC
HCT: 32.4 % — ABNORMAL LOW (ref 36.0–46.0)
Hemoglobin: 10.5 g/dL — ABNORMAL LOW (ref 12.0–15.0)
MCH: 29.1 pg (ref 26.0–34.0)
MCHC: 32.4 g/dL (ref 30.0–36.0)
MCV: 89.8 fL (ref 78.0–100.0)
PLATELETS: 293 10*3/uL (ref 150–400)
RBC: 3.61 MIL/uL — AB (ref 3.87–5.11)
RDW: 12 % (ref 11.5–15.5)
WBC: 9.9 10*3/uL (ref 4.0–10.5)

## 2018-08-04 NOTE — Progress Notes (Signed)
Occupational Therapy Treatment Patient Details Name: Donna Casey MRN: [id number] DOB: [birth year] Today's Date: 08/04/2018    History of present illness Pt is a 57 y/o female s/p L TKA. PMH includes DM, GERD, HTN, R THA, and cervical surgery.    OT comments  Pt performed toileting and standing grooming with min guard assist. Assisted for LB bathing and dressing after urinary incontinence. Recommended long handled bath sponge to reach L foot. Instructed to dress L LE first. Educated in use of 3 in 1 as shower seat and gave handout. Pt will have 24 hour assist of her daughters and husband who will assist with LB ADL and all IADL until pt can perform on her own as PTA.   Follow Up Recommendations  Follow surgeon's recommendation for DC plan and follow-up therapies    Equipment Recommendations  3 in 1 bedside commode(delivered to room)    Recommendations for Other Services      Precautions / Restrictions Precautions Precautions: Knee Precaution Comments: Reviewed knee precautions and positioning of LE following TKA surgery Restrictions Weight Bearing Restrictions: Yes LLE Weight Bearing: Weight bearing as tolerated       Mobility Bed Mobility Overal bed mobility: Needs Assistance Bed Mobility: Sit to Supine      Sit to supine: Min assist   General bed mobility comments: instructed pt in self assisting L LE to return to bed  Transfers Overall transfer level: Needs assistance Equipment used: Rolling walker (2 wheeled) Transfers: Sit to/from Stand Sit to Stand: Min guard         General transfer comment: increased time, cues for technique from recliner and 3 in 1    Balance Overall balance assessment: Needs assistance Sitting-balance support: No upper extremity supported;Feet supported Sitting balance-Leahy Scale: Good     Standing balance support: Bilateral upper extremity supported;During functional activity Standing balance-Leahy Scale: Poor Standing balance  comment: Relies on at least one hand support in static standing.                           ADL either performed or assessed with clinical judgement   ADL Overall ADL's : Needs assistance/impaired             Lower Body Bathing: Minimal assistance;Sit to/from stand Lower Body Bathing Details (indicate cue type and reason): recommended long handled sponge     Lower Body Dressing: Moderate assistance;Sit to/from stand Lower Body Dressing Details (indicate cue type and reason): instructed to dress L LE first and then R Toilet Transfer: Min guard;Ambulation;RW;BSC(over toilet)   Toileting- Clothing Manipulation and Hygiene: Min guard;Sit to/from Nurse, children's Details (indicate cue type and reason): educated in tub transfer using 3 in 1, reinforced with handout Functional mobility during ADLs: Min guard;Rolling walker General ADL Comments: Pt will have 24 hour care of her family x 3 weeks, did not educate in use of AE.     Vision       Perception     Praxis      Cognition Arousal/Alertness: Awake/alert Behavior During Therapy: WFL for tasks assessed/performed Overall Cognitive Status: Within Functional Limits for tasks assessed                                          Exercises     Shoulder Instructions  General Comments      Pertinent Vitals/ Pain       Pain Assessment: 0-10 Pain Score: 6  Pain Location: L knee  Pain Descriptors / Indicators: Grimacing;Operative site guarding;Aching;Sore Pain Intervention(s): Premedicated before session;Repositioned;Ice applied  Home Living                                          Prior Functioning/Environment              Frequency  Min 2X/week        Progress Toward Goals  OT Goals(current goals can now be found in the care plan section)  Progress towards OT goals: Progressing toward goals  Acute Rehab OT Goals Patient Stated Goal: to  decrease pain  OT Goal Formulation: With patient Time For Goal Achievement: 08/17/18 Potential to Achieve Goals: Good  Plan Discharge plan remains appropriate    Co-evaluation                 AM-PAC PT "6 Clicks" Daily Activity     Outcome Measure   Help from another person eating meals?: None Help from another person taking care of personal grooming?: A Little Help from another person toileting, which includes using toliet, bedpan, or urinal?: A Little Help from another person bathing (including washing, rinsing, drying)?: A Little Help from another person to put on and taking off regular upper body clothing?: None Help from another person to put on and taking off regular lower body clothing?: A Lot 6 Click Score: 19    End of Session Equipment Utilized During Treatment: Gait belt;Rolling walker  OT Visit Diagnosis: Other abnormalities of gait and mobility (R26.89);Pain Pain - Right/Left: Left Pain - part of body: Knee   Activity Tolerance Patient tolerated treatment well   Patient Left in bed;with call bell/phone within reach   Nurse Communication          Time: 1610-9604 OT Time Calculation (min): 37 min  Charges: OT General Charges $OT Visit: 1 Visit OT Treatments $Self Care/Home Management : 23-37 mins   Evern Bio 08/04/2018, 2:44 PM  08/04/2018 Martie Round, OTR/L Pager: [phone]

## 2018-08-04 NOTE — Care Management Note (Signed)
Case Management Note  Patient Details  Name: Donna Casey MRN: [id number] Date of Birth: 1961/11/06  Subjective/Objective:  57 yr old female s/p left total knee Arthroplasty.                Action/Plan:  Patient was preoperatively setup with [facility], No changes.  Patient will have support at discharge.   Expected Discharge Date:    95/19            Expected Discharge Plan:  Home w Home Health Services  In-House Referral:  NA  Discharge planning Services  CM Consult  Post Acute Care Choice:  Home Health Choice offered to:  Patient  DME Arranged:  N/A(has RW and 3in1) DME Agency:  [employer]  HH Arranged:  PT, OT HH Agency:  [facility] (formerly [facility])  Status of Service:  Completed, signed off  If discussed at [employer], dates discussed:    Additional Comments:  Durenda Guthrie, RN 08/04/2018, 12:45 PM

## 2018-08-04 NOTE — Progress Notes (Addendum)
Patient ID: Donna Casey, female   DOB: 05-Oct-1961, 57 y.o.   MRN: [id number]     Subjective:  Patient reports pain as mild.  Patient in bed and in no acute distress and denies any CP or SOB  Objective:   VITALS:   Vitals:   08/03/18 0839 08/03/18 1724 08/03/18 2216 08/04/18 0424  BP: 118/73 (!) 140/93 95/60 111/78  Pulse: (!) 110 (!) 109 98 92  Resp: 16 16  15   Temp: 98.5 F (36.9 C) 98.5 F (36.9 C) 98.1 F (36.7 C) 98.2 F (36.8 C)  TempSrc: Oral Oral Oral Oral  SpO2: 99% 96% 96% 97%  Weight:      Height:        ABD soft Sensation intact distally Dorsiflexion/Plantar flexion intact Incision: dressing C/D/I and no drainage   Lab Results  Component Value Date   WBC 9.9 08/04/2018   HGB 10.5 (L) 08/04/2018   HCT 32.4 (L) 08/04/2018   MCV 89.8 08/04/2018   PLT 293 08/04/2018   BMET    Component Value Date/Time   NA 134 (L) 08/03/2018 0359   NA 141 03/11/2017 0922   K 3.2 (L) 08/03/2018 0359   CL 101 08/03/2018 0359   CO2 25 08/03/2018 0359   GLUCOSE 210 (H) 08/03/2018 0359   BUN 15 08/03/2018 0359   BUN 16 03/11/2017 0922   CREATININE 0.99 08/03/2018 0359   CALCIUM 8.7 (L) 08/03/2018 0359   GFRNONAA >60 08/03/2018 0359   GFRAA >60 08/03/2018 0359     Assessment/Plan: 2 Days Post-Op   Principal Problem:   Primary localized osteoarthritis of left knee   Advance diet Up with therapy Plan for discharge tomorrow WBAT Dry dressing PRN      Haskel Khan 08/04/2018, 3:23 PM  Discussed and agree with above.   Teryl Lucy, MD Cell [phone]

## 2018-08-04 NOTE — Progress Notes (Signed)
Physical Therapy Treatment Patient Details Name: Donna Casey MRN: [id number] DOB: 1961/06/14 Today's Date: 08/04/2018    History of Present Illness Pt is a 57 y/o female s/p L TKA. PMH includes DM, GERD, HTN, R THA, and cervical surgery.     PT Comments    Pt making steady progress with functional mobility and tolerated ambulation in hallway with RW and min guard for safety. Pt will need to participate in stair training prior to d/c home. Pt would continue to benefit from skilled physical therapy services at this time while admitted and after d/c to address the below listed limitations in order to improve overall safety and independence with functional mobility.\    Follow Up Recommendations  Home health PT;Supervision/Assistance - 24 hour     Equipment Recommendations  3in1 (PT)    Recommendations for Other Services OT consult     Precautions / Restrictions Precautions Precautions: Knee Precaution Comments: Reviewed knee precautions and positioning of LE following TKA surgery Restrictions Weight Bearing Restrictions: Yes LLE Weight Bearing: Weight bearing as tolerated    Mobility  Bed Mobility Overal bed mobility: Needs Assistance Bed Mobility: Supine to Sit     Supine to sit: Min assist     General bed mobility comments: increased time, min A to move L LE off of bed  Transfers Overall transfer level: Needs assistance Equipment used: Rolling walker (2 wheeled) Transfers: Sit to/from Stand Sit to Stand: Min guard         General transfer comment: min guard for safety, cueing for technique, x1 from EOB and x1 from toilet  Ambulation/Gait Ambulation/Gait assistance: Min guard Gait Distance (Feet): 100 Feet Assistive device: Rolling walker (2 wheeled) Gait Pattern/deviations: Step-through pattern;Decreased step length - right;Decreased step length - left;Decreased stride length Gait velocity: decreased Gait velocity interpretation: <1.31 ft/sec,  indicative of household ambulator General Gait Details: pt with mild instability but no overt LOB or need for physical assistance, min guard for safety; pt limited secondary to fatigue and required several standing rest breaks   Stairs             Wheelchair Mobility    Modified Rankin (Stroke Patients Only)       Balance Overall balance assessment: Needs assistance Sitting-balance support: No upper extremity supported;Feet supported Sitting balance-Leahy Scale: Fair     Standing balance support: Bilateral upper extremity supported;During functional activity Standing balance-Leahy Scale: Poor Standing balance comment: Relies on BUE support.                             Cognition Arousal/Alertness: Awake/alert Behavior During Therapy: WFL for tasks assessed/performed Overall Cognitive Status: Within Functional Limits for tasks assessed                                        Exercises      General Comments        Pertinent Vitals/Pain Pain Assessment: 0-10 Pain Score: 8  Pain Location: L knee  Pain Descriptors / Indicators: Grimacing;Operative site guarding;Aching;Sore Pain Intervention(s): Monitored during session;Repositioned    Home Living                      Prior Function            PT Goals (current goals can now be found in the care plan section)  Acute Rehab PT Goals PT Goal Formulation: With patient Time For Goal Achievement: 08/16/18 Potential to Achieve Goals: Good Progress towards PT goals: Progressing toward goals    Frequency    7X/week      PT Plan Current plan remains appropriate    Co-evaluation              AM-PAC PT "6 Clicks" Daily Activity  Outcome Measure  Difficulty turning over in bed (including adjusting bedclothes, sheets and blankets)?: Unable Difficulty moving from lying on back to sitting on the side of the bed? : Unable Difficulty sitting down on and standing up from a  chair with arms (e.g., wheelchair, bedside commode, etc,.)?: Unable Help needed moving to and from a bed to chair (including a wheelchair)?: A Little Help needed walking in hospital room?: A Little Help needed climbing 3-5 steps with a railing? : A Little 6 Click Score: 12    End of Session Equipment Utilized During Treatment: Gait belt Activity Tolerance: Patient limited by pain Patient left: in chair;with call bell/phone within reach;with family/visitor present Nurse Communication: Mobility status PT Visit Diagnosis: Other abnormalities of gait and mobility (R26.89);Pain Pain - Right/Left: Left Pain - part of body: Knee     Time: 9562-1308 PT Time Calculation (min) (ACUTE ONLY): 29 min  Charges:  $Gait Training: 8-22 mins $Therapeutic Activity: 8-22 mins                     Deborah Chalk, PT, DPT  Acute Rehabilitation Services Pager [phone] Office [phone]     Donna Casey 08/04/2018, 2:22 PM

## 2018-08-04 NOTE — Progress Notes (Signed)
Physical Therapy Treatment Patient Details Name: Donna Casey MRN: [id number] DOB: 1961/06/28 Today's Date: 08/04/2018    History of Present Illness Pt is a 57 y/o female s/p L TKA. PMH includes DM, GERD, HTN, R THA, and cervical surgery.     PT Comments    Pt making steady progress with functional mobility. Will plan for stair training at next session prior to her d/c home tomorrow.   Pt would continue to benefit from skilled physical therapy services at this time while admitted and after d/c to address the below listed limitations in order to improve overall safety and independence with functional mobility.    Follow Up Recommendations  Home health PT;Supervision/Assistance - 24 hour     Equipment Recommendations  3in1 (PT)    Recommendations for Other Services OT consult     Precautions / Restrictions Precautions Precautions: Knee Precaution Comments: Reviewed knee precautions and positioning of LE following TKA surgery Restrictions Weight Bearing Restrictions: Yes LLE Weight Bearing: Weight bearing as tolerated    Mobility  Bed Mobility Overal bed mobility: Needs Assistance Bed Mobility: Supine to Sit;Sit to Supine     Supine to sit: Min guard Sit to supine: Min assist   General bed mobility comments: increased time and effort, cueing to use R LE to assist L LE off of bed; pt required min A to return L LE onto bed  Transfers Overall transfer level: Needs assistance Equipment used: Rolling walker (2 wheeled) Transfers: Sit to/from Stand Sit to Stand: Min assist         General transfer comment: increased time and effort, cueing for safe hand placement, assist to power into standing from EOB  Ambulation/Gait Ambulation/Gait assistance: Min guard Gait Distance (Feet): 100 Feet Assistive device: Rolling walker (2 wheeled) Gait Pattern/deviations: Step-through pattern;Decreased step length - right;Decreased step length - left;Decreased stride  length Gait velocity: decreased Gait velocity interpretation: <1.31 ft/sec, indicative of household ambulator General Gait Details: focus of session was on LE HEP   Stairs             Wheelchair Mobility    Modified Rankin (Stroke Patients Only)       Balance Overall balance assessment: Needs assistance Sitting-balance support: No upper extremity supported;Feet supported Sitting balance-Leahy Scale: Good     Standing balance support: Bilateral upper extremity supported;During functional activity Standing balance-Leahy Scale: Poor Standing balance comment: Relies on at least one hand support in static standing.                            Cognition Arousal/Alertness: Awake/alert Behavior During Therapy: WFL for tasks assessed/performed Overall Cognitive Status: Within Functional Limits for tasks assessed                                        Exercises Total Joint Exercises Quad Sets: AROM;Strengthening;Left;10 reps;Supine Hip ABduction/ADduction: AAROM;Left;10 reps;Supine Straight Leg Raises: AAROM;Left;10 reps;Supine Long Arc Quad: AAROM;Left;10 reps;Seated Knee Flexion: AAROM;Left;10 reps;Seated Goniometric ROM: Flexion = 65 degrees; Extension = lacking 15 degrees to neutral General Exercises - Lower Extremity Mini-Sqauts: AROM;Strengthening;Both;10 reps;Standing    General Comments        Pertinent Vitals/Pain Pain Assessment: Faces Pain Score: 6  Faces Pain Scale: Hurts even more Pain Location: L knee  Pain Descriptors / Indicators: Grimacing;Operative site guarding;Aching;Sore Pain Intervention(s): Monitored during session;Repositioned;Ice applied  Home Living                      Prior Function            PT Goals (current goals can now be found in the care plan section) Acute Rehab PT Goals Patient Stated Goal: to decrease pain  PT Goal Formulation: With patient Time For Goal Achievement:  08/16/18 Potential to Achieve Goals: Good Progress towards PT goals: Progressing toward goals    Frequency    7X/week      PT Plan Current plan remains appropriate    Co-evaluation              AM-PAC PT "6 Clicks" Daily Activity  Outcome Measure  Difficulty turning over in bed (including adjusting bedclothes, sheets and blankets)?: Unable Difficulty moving from lying on back to sitting on the side of the bed? : Unable Difficulty sitting down on and standing up from a chair with arms (e.g., wheelchair, bedside commode, etc,.)?: Unable Help needed moving to and from a bed to chair (including a wheelchair)?: A Little Help needed walking in hospital room?: A Little Help needed climbing 3-5 steps with a railing? : A Little 6 Click Score: 12    End of Session Equipment Utilized During Treatment: Gait belt Activity Tolerance: Patient limited by pain Patient left: in bed;with call bell/phone within reach Nurse Communication: Mobility status PT Visit Diagnosis: Other abnormalities of gait and mobility (R26.89);Pain Pain - Right/Left: Left Pain - part of body: Knee     Time: 1610-9604 PT Time Calculation (min) (ACUTE ONLY): 27 min  Charges:  $Therapeutic Exercise: 8-22 mins $Therapeutic Activity: 8-22 mins                     Deborah Chalk, PT, DPT  Acute Rehabilitation Services Pager [phone] Office [phone]     [name] 08/04/2018, 4:43 PM

## 2018-08-05 ENCOUNTER — Encounter (HOSPITAL_COMMUNITY): Payer: Self-pay | Admitting: Orthopedic Surgery

## 2018-08-05 LAB — CBC
HCT: 32.5 % — ABNORMAL LOW (ref 36.0–46.0)
HEMOGLOBIN: 10.1 g/dL — AB (ref 12.0–15.0)
MCH: 28.4 pg (ref 26.0–34.0)
MCHC: 31.1 g/dL (ref 30.0–36.0)
MCV: 91.3 fL (ref 78.0–100.0)
Platelets: 331 10*3/uL (ref 150–400)
RBC: 3.56 MIL/uL — AB (ref 3.87–5.11)
RDW: 12.3 % (ref 11.5–15.5)
WBC: 9.4 10*3/uL (ref 4.0–10.5)

## 2018-08-05 NOTE — Progress Notes (Signed)
Pt given discharge instructions and gone over with her and daughter present. Answered all questions to satisfaction. All belongings gathered to be sent home. Pt in no distress at time of discharge.

## 2018-08-05 NOTE — Progress Notes (Addendum)
Patient ID: Donna Casey, female   DOB: Jan 04, 1961, 57 y.o.   MRN: [id number]     Subjective:  Patient reports pain as mild.  Patient in bed and in no acute distress  Objective:   VITALS:   Vitals:   08/04/18 0424 08/04/18 1631 08/04/18 2023 08/05/18 0339  BP: 111/78 129/77 114/75 130/88  Pulse: 92 (!) 102 (!) 106 (!) 118  Resp: 15  20 18   Temp: 98.2 F (36.8 C) 98.3 F (36.8 C) 98.5 F (36.9 C) 98.3 F (36.8 C)  TempSrc: Oral Oral Oral Oral  SpO2: 97% 99% 99% 98%  Weight:      Height:        ABD soft Sensation intact distally Dorsiflexion/Plantar flexion intact Incision: dressing C/D/I and no drainage Dressing changed  Lab Results  Component Value Date   WBC 9.4 08/05/2018   HGB 10.1 (L) 08/05/2018   HCT 32.5 (L) 08/05/2018   MCV 91.3 08/05/2018   PLT 331 08/05/2018   BMET    Component Value Date/Time   NA 134 (L) 08/03/2018 0359   NA 141 03/11/2017 0922   K 3.2 (L) 08/03/2018 0359   CL 101 08/03/2018 0359   CO2 25 08/03/2018 0359   GLUCOSE 210 (H) 08/03/2018 0359   BUN 15 08/03/2018 0359   BUN 16 03/11/2017 0922   CREATININE 0.99 08/03/2018 0359   CALCIUM 8.7 (L) 08/03/2018 0359   GFRNONAA >60 08/03/2018 0359   GFRAA >60 08/03/2018 0359     Assessment/Plan: 3 Days Post-Op   Principal Problem:   Primary localized osteoarthritis of left knee   Advance diet Up with therapy Discharge home with home health WBAT Dry dressing PRN Follow up with Dr Dion Saucier as scheduled      Torrie Mayers, Apolinar Junes 08/05/2018, 8:49 AM  Discussed and agree with above.   Teryl Lucy, MD Cell [phone]

## 2018-08-05 NOTE — Plan of Care (Signed)

## 2018-08-05 NOTE — Progress Notes (Signed)
Physical Therapy Treatment Patient Details Name: Donna Casey MRN: [id number] DOB: 03/10/1961 Today's Date: 08/05/2018    History of Present Illness Pt is a 57 y/o female s/p L TKA. PMH includes DM, GERD, HTN, R THA, and cervical surgery.     PT Comments    Pt performed increased activity including increased gait and stair training for safe entry into home and into den.  Pt is slow and guarded and increasingly tired as activity continued.  Pt requesting rest break and wanting to retrial stairs in pm to allow her daughter to watch her negotiate the stairs.  Plan for repeated tx this pm followed by d/c home.    Follow Up Recommendations  Home health PT;Supervision/Assistance - 24 hour     Equipment Recommendations  3in1 (PT)    Recommendations for Other Services       Precautions / Restrictions Precautions Precautions: Knee Precaution Booklet Issued: Yes (comment) Precaution Comments: Reviewed knee precautions and positioning of LE following TKA surgery Restrictions Weight Bearing Restrictions: Yes LLE Weight Bearing: Weight bearing as tolerated    Mobility  Bed Mobility               General bed mobility comments: Pt sitting in recliner on arrival.    Transfers Overall transfer level: Needs assistance Equipment used: Rolling walker (2 wheeled) Transfers: Sit to/from Stand Sit to Stand: Supervision Stand pivot transfers: Supervision       General transfer comment: Pt performed with cues for hand placement to and from seated surface.  Poor eccentric loading on descent and required education to resolve this issue.  Ambulation/Gait Ambulation/Gait assistance: Min guard Gait Distance (Feet): 200 Feet Assistive device: Rolling walker (2 wheeled) Gait Pattern/deviations: Step-through pattern;Decreased step length - right;Decreased step length - left;Decreased stride length     General Gait Details: Cues for upper trunk control, forward gaze and progression to  step through pattern.  Pt required cues for gait symmetry.     Stairs Stairs: Yes Stairs assistance: Min guard;Min assist Stair Management: No rails;Step to pattern;Backwards;Forwards;With walker Number of Stairs: 8 General stair comments: x2 curb steps forward with RW and min guard assistance: cues for sequencing and RW placement.  x2 curb step backwards with RW and min guard assistance: cues for sequencing and RW placement.  x4 backwards consecutive stairs in a row with cues for sequencing and RW placement and min assist to guard RW at an angle.     Wheelchair Mobility    Modified Rankin (Stroke Patients Only)       Balance Overall balance assessment: Needs assistance   Sitting balance-Leahy Scale: Good       Standing balance-Leahy Scale: Poor                              Cognition Arousal/Alertness: Awake/alert Behavior During Therapy: WFL for tasks assessed/performed Overall Cognitive Status: Within Functional Limits for tasks assessed                                        Exercises      General Comments        Pertinent Vitals/Pain Pain Assessment: Faces Faces Pain Scale: Hurts even more Pain Location: L knee  Pain Descriptors / Indicators: Grimacing;Operative site guarding;Aching;Sore Pain Intervention(s): Monitored during session;Repositioned    Home Living  Prior Function            PT Goals (current goals can now be found in the care plan section) Acute Rehab PT Goals Patient Stated Goal: to decrease pain  Potential to Achieve Goals: Good Progress towards PT goals: Progressing toward goals    Frequency    7X/week      PT Plan Current plan remains appropriate    Co-evaluation              AM-PAC PT "6 Clicks" Daily Activity  Outcome Measure  Difficulty turning over in bed (including adjusting bedclothes, sheets and blankets)?: Unable Difficulty moving from lying on back  to sitting on the side of the bed? : Unable Difficulty sitting down on and standing up from a chair with arms (e.g., wheelchair, bedside commode, etc,.)?: Unable Help needed moving to and from a bed to chair (including a wheelchair)?: A Little Help needed walking in hospital room?: A Little Help needed climbing 3-5 steps with a railing? : A Little 6 Click Score: 12    End of Session Equipment Utilized During Treatment: Gait belt Activity Tolerance: Patient limited by pain Patient left: in bed;with call bell/phone within reach Nurse Communication: Mobility status PT Visit Diagnosis: Other abnormalities of gait and mobility (R26.89);Pain Pain - Right/Left: Left Pain - part of body: Knee     Time: 1128-1208 PT Time Calculation (min) (ACUTE ONLY): 40 min  Charges:  $Gait Training: 23-37 mins $Therapeutic Activity: 8-22 mins                     Joycelyn Rua, PTA Acute Rehabilitation Services Pager [phone] Office [phone]     Donna Casey Delay 08/05/2018, 12:37 PM

## 2018-08-05 NOTE — Discharge Summary (Signed)
Physician Discharge Summary  Patient ID: Donna Casey MRN: [id number] DOB/AGE: 1961-09-26 57 y.o.  Admit date: 08/02/2018 Discharge date: 08/05/2018  Admission Diagnoses:  Primary localized osteoarthritis of left knee  Discharge Diagnoses:  Principal Problem:   Primary localized osteoarthritis of left knee Estimated body mass index is 38.42 kg/m as calculated from the following:   Height as of this encounter: 5\' 9"  (1.753 m).   Weight as of this encounter: 118 kg. obesity  Past Medical History:  Diagnosis Date  . Anemia   . Arthritis   . Diabetes mellitus without complication (HCC)   . Enlarged liver   . Family history of adverse reaction to anesthesia    N/V  . Fatty liver   . GERD (gastroesophageal reflux disease)   . H pylori ulcer   . Heart palpitations   . Hypertension   . Numbness and tingling in hands    right hand  . PONV (postoperative nausea and vomiting)   . Primary localized osteoarthritis of left knee 08/02/2018    Surgeries: Procedure(s): TOTAL KNEE ARTHROPLASTY on 08/02/2018   Consultants (if any):   Discharged Condition: Improved  Hospital Course: Donna Casey is an 57 y.o. female who was admitted 08/02/2018 with a diagnosis of Primary localized osteoarthritis of left knee and went to the operating room on 08/02/2018 and underwent the above named procedures.    She was given perioperative antibiotics:  Anti-infectives (From admission, onward)   Start     Dose/Rate Route Frequency Ordered Stop   08/02/18 1400  ceFAZolin (ANCEF) IVPB 2g/100 mL premix     2 g 200 mL/hr over 30 Minutes Intravenous Every 6 hours 08/02/18 1250 08/02/18 2130   08/02/18 0600  ceFAZolin (ANCEF) IVPB 2g/100 mL premix     2 g 200 mL/hr over 30 Minutes Intravenous On call to O.R. 08/02/18 7078 08/02/18 0755    .  She was given sequential compression devices, early ambulation, and aspirin for DVT prophylaxis.  She benefited maximally from the hospital stay and there were  no complications.    Recent vital signs:  Vitals:   08/05/18 0339 08/05/18 0849  BP: 130/88 101/66  Pulse: (!) 118 (!) 110  Resp: 18   Temp: 98.3 F (36.8 C) 97.9 F (36.6 C)  SpO2: 98% 99%    Recent laboratory studies:  Lab Results  Component Value Date   HGB 10.1 (L) 08/05/2018   HGB 10.5 (L) 08/04/2018   HGB 10.7 (L) 08/03/2018   Lab Results  Component Value Date   WBC 9.4 08/05/2018   PLT 331 08/05/2018   Lab Results  Component Value Date   INR 2.1 (H) 06/24/2008   Lab Results  Component Value Date   NA 134 (L) 08/03/2018   K 3.2 (L) 08/03/2018   CL 101 08/03/2018   CO2 25 08/03/2018   BUN 15 08/03/2018   CREATININE 0.99 08/03/2018   GLUCOSE 210 (H) 08/03/2018    Discharge Medications:   Allergies as of 08/05/2018      Reactions   Oxycodone Nausea And Vomiting      Medication List    TAKE these medications   amLODipine 5 MG tablet Commonly known as:  NORVASC Take 5 mg by mouth daily.   aspirin 325 MG EC tablet Take 1 tablet (325 mg total) by mouth 2 (two) times daily.   hydrochlorothiazide 25 MG tablet Commonly known as:  HYDRODIURIL Take 25 mg by mouth daily.   HYDROcodone-acetaminophen 10-325 MG tablet Commonly  known as:  NORCO Take 1 tablet by mouth every 6 (six) hours as needed.   losartan 25 MG tablet Commonly known as:  COZAAR Take 25 mg by mouth daily.   meloxicam 15 MG tablet Commonly known as:  MOBIC Take 1 tablet (15 mg total) by mouth daily.   metFORMIN 500 MG 24 hr tablet Commonly known as:  GLUCOPHAGE-XR Take 500 mg by mouth daily.   methocarbamol 750 MG tablet Commonly known as:  ROBAXIN Take 1 tablet (750 mg total) by mouth every 8 (eight) hours as needed for muscle spasms.   omeprazole 20 MG capsule Commonly known as:  PRILOSEC Take 20 mg by mouth daily.   ondansetron 4 MG tablet Commonly known as:  ZOFRAN Take 1 tablet (4 mg total) by mouth every 8 (eight) hours as needed for nausea or vomiting.    sennosides-docusate sodium 8.6-50 MG tablet Commonly known as:  SENOKOT-S Take 2 tablets by mouth daily.       Diagnostic Studies: Dg Knee Left Port  Result Date: 08/02/2018 CLINICAL DATA:  Status post left total knee joint prosthesis placement. EXAM: PORTABLE LEFT KNEE - 1-2 VIEW COMPARISON:  Preoperative study of October 05, 2017 FINDINGS: The patient has undergone placement of a total knee joint prosthesis. Radiographic positioning appears good. The interface with the native bone appears normal. No acute native bone abnormality is observed. There is small amount of air in fluid within the anterior aspect of the joint. IMPRESSION: No immediate complication following left total knee joint prosthesis placement. Electronically Signed   By: David  [place] M.D.   On: 08/02/2018 11:17    Disposition: Discharge disposition: 01-Home or Self Care         Follow-up Information    Teryl Lucy, MD. Schedule an appointment as soon as possible for a visit in 2 weeks.   Specialty:  Orthopedic Surgery Contact information: [address] [place] [address] [phone]        [facility] Follow up.   Specialty:  Home Health Services Why:  A representative from [facility] will contact you to arrange start date and time for your therapy. Contact information: [address] [place] [address] [place] [address] [phone]            Signed: Eulas Post 08/05/2018, 9:02 AM

## 2018-08-05 NOTE — Progress Notes (Signed)
Physical Therapy Treatment Patient Details Name: Donna Casey MRN: [id number] DOB: Aug 08, 1961 Today's Date: 08/05/2018    History of Present Illness Pt is a 57 y/o female s/p L TKA. PMH includes DM, GERD, HTN, R THA, and cervical surgery.     PT Comments    Pt performed limited gait with focus on stair training with daughter present this pm.  Reviewed supine exercises to ensure accuracy through demonstration and educated patient on frequency.  Plan for d/c home after return to room.     Follow Up Recommendations  Home health PT;Supervision/Assistance - 24 hour     Equipment Recommendations  3in1 (PT)    Recommendations for Other Services OT consult     Precautions / Restrictions Precautions Precautions: Knee Precaution Booklet Issued: Yes (comment) Precaution Comments: Reviewed knee precautions and positioning of LE following TKA surgery Restrictions Weight Bearing Restrictions: Yes LLE Weight Bearing: Weight bearing as tolerated    Mobility  Bed Mobility               General bed mobility comments: Pt sitting in recliner on arrival.    Transfers Overall transfer level: Needs assistance Equipment used: Rolling walker (2 wheeled) Transfers: Sit to/from Stand Sit to Stand: Supervision Stand pivot transfers: Supervision       General transfer comment: Pt performed with cues for hand placement to and from seated surface. Improved eccentric loading on descent.  Ambulation/Gait Ambulation/Gait assistance: Min guard Gait Distance (Feet): (25 ft during stair training in gym ( around gym)) Assistive device: Rolling walker (2 wheeled) Gait Pattern/deviations: Step-through pattern;Decreased step length - right;Decreased step length - left;Decreased stride length Gait velocity: decreased   General Gait Details: Cues for upper trunk control, forward gaze and progression to step through pattern.  Pt required cues for gait symmetry.     Stairs Stairs:  Yes Stairs assistance: Min guard;Min assist Stair Management: No rails;Step to pattern;Backwards;Forwards;With walker Number of Stairs: 8 General stair comments: Performed with daughter present this technique and participating in assistance for carryover at home.  x2 curb steps forward with RW and min guard assistance: cues for sequencing and RW placement.  x2 curb step backwards with RW and min guard assistance: cues for sequencing and RW placement.  x4 backwards consecutive stairs in a row with cues for sequencing and RW placement and min assist to guard RW at an angle.     Wheelchair Mobility    Modified Rankin (Stroke Patients Only)       Balance Overall balance assessment: Needs assistance   Sitting balance-Leahy Scale: Good                                      Cognition Arousal/Alertness: Awake/alert Behavior During Therapy: WFL for tasks assessed/performed Overall Cognitive Status: Within Functional Limits for tasks assessed                                        Exercises      General Comments        Pertinent Vitals/Pain Pain Assessment: Faces Faces Pain Scale: Hurts even more Pain Location: L knee  Pain Descriptors / Indicators: Grimacing;Operative site guarding;Aching;Sore Pain Intervention(s): Monitored during session;Repositioned    Home Living  Prior Function            PT Goals (current goals can now be found in the care plan section) Acute Rehab PT Goals Patient Stated Goal: to decrease pain  Potential to Achieve Goals: Good Progress towards PT goals: Progressing toward goals    Frequency    7X/week      PT Plan Current plan remains appropriate    Co-evaluation              AM-PAC PT "6 Clicks" Daily Activity  Outcome Measure  Difficulty turning over in bed (including adjusting bedclothes, sheets and blankets)?: Unable Difficulty moving from lying on back to sitting  on the side of the bed? : Unable Difficulty sitting down on and standing up from a chair with arms (e.g., wheelchair, bedside commode, etc,.)?: None Help needed moving to and from a bed to chair (including a wheelchair)?: None Help needed walking in hospital room?: None Help needed climbing 3-5 steps with a railing? : A Little 6 Click Score: 17    End of Session Equipment Utilized During Treatment: Gait belt Activity Tolerance: Patient limited by pain Patient left: in bed;with call bell/phone within reach Nurse Communication: Mobility status PT Visit Diagnosis: Other abnormalities of gait and mobility (R26.89);Pain Pain - Right/Left: Left Pain - part of body: Knee     Time: 5188-4166 PT Time Calculation (min) (ACUTE ONLY): 24 min  Charges:  $Gait Training: 8-22 mins $Therapeutic Activity: 8-22 mins                     Joycelyn Rua, PTA Acute Rehabilitation Services Pager [phone] Office [phone]     Marrion Accomando Artis Delay 08/05/2018, 4:50 PM

## 2018-08-06 DIAGNOSIS — Z96652 Presence of left artificial knee joint: Secondary | ICD-10-CM | POA: Diagnosis not present

## 2018-08-11 DIAGNOSIS — Z96652 Presence of left artificial knee joint: Secondary | ICD-10-CM | POA: Diagnosis not present

## 2018-08-12 DIAGNOSIS — Z96652 Presence of left artificial knee joint: Secondary | ICD-10-CM | POA: Diagnosis not present

## 2018-08-15 DIAGNOSIS — Z96652 Presence of left artificial knee joint: Secondary | ICD-10-CM | POA: Diagnosis not present

## 2018-08-16 DIAGNOSIS — Z96652 Presence of left artificial knee joint: Secondary | ICD-10-CM | POA: Diagnosis not present

## 2018-08-17 DIAGNOSIS — Z96652 Presence of left artificial knee joint: Secondary | ICD-10-CM | POA: Diagnosis not present

## 2018-08-23 DIAGNOSIS — M25662 Stiffness of left knee, not elsewhere classified: Secondary | ICD-10-CM | POA: Diagnosis not present

## 2018-08-23 DIAGNOSIS — Z471 Aftercare following joint replacement surgery: Secondary | ICD-10-CM | POA: Diagnosis not present

## 2018-08-23 DIAGNOSIS — M1712 Unilateral primary osteoarthritis, left knee: Secondary | ICD-10-CM | POA: Diagnosis not present

## 2018-08-23 DIAGNOSIS — R262 Difficulty in walking, not elsewhere classified: Secondary | ICD-10-CM | POA: Diagnosis not present

## 2018-08-25 DIAGNOSIS — M25562 Pain in left knee: Secondary | ICD-10-CM | POA: Diagnosis not present

## 2018-08-25 DIAGNOSIS — R262 Difficulty in walking, not elsewhere classified: Secondary | ICD-10-CM | POA: Diagnosis not present

## 2018-08-25 DIAGNOSIS — M25662 Stiffness of left knee, not elsewhere classified: Secondary | ICD-10-CM | POA: Diagnosis not present

## 2018-08-25 DIAGNOSIS — M1712 Unilateral primary osteoarthritis, left knee: Secondary | ICD-10-CM | POA: Diagnosis not present

## 2018-08-25 DIAGNOSIS — Z471 Aftercare following joint replacement surgery: Secondary | ICD-10-CM | POA: Diagnosis not present

## 2018-08-30 DIAGNOSIS — M25662 Stiffness of left knee, not elsewhere classified: Secondary | ICD-10-CM | POA: Diagnosis not present

## 2018-08-30 DIAGNOSIS — M1712 Unilateral primary osteoarthritis, left knee: Secondary | ICD-10-CM | POA: Diagnosis not present

## 2018-08-30 DIAGNOSIS — R262 Difficulty in walking, not elsewhere classified: Secondary | ICD-10-CM | POA: Diagnosis not present

## 2018-08-30 DIAGNOSIS — Z471 Aftercare following joint replacement surgery: Secondary | ICD-10-CM | POA: Diagnosis not present

## 2018-09-01 DIAGNOSIS — Z471 Aftercare following joint replacement surgery: Secondary | ICD-10-CM | POA: Diagnosis not present

## 2018-09-01 DIAGNOSIS — M25662 Stiffness of left knee, not elsewhere classified: Secondary | ICD-10-CM | POA: Diagnosis not present

## 2018-09-01 DIAGNOSIS — M1712 Unilateral primary osteoarthritis, left knee: Secondary | ICD-10-CM | POA: Diagnosis not present

## 2018-09-01 DIAGNOSIS — R262 Difficulty in walking, not elsewhere classified: Secondary | ICD-10-CM | POA: Diagnosis not present

## 2018-09-06 DIAGNOSIS — M1712 Unilateral primary osteoarthritis, left knee: Secondary | ICD-10-CM | POA: Diagnosis not present

## 2018-09-06 DIAGNOSIS — Z471 Aftercare following joint replacement surgery: Secondary | ICD-10-CM | POA: Diagnosis not present

## 2018-09-06 DIAGNOSIS — M25662 Stiffness of left knee, not elsewhere classified: Secondary | ICD-10-CM | POA: Diagnosis not present

## 2018-09-06 DIAGNOSIS — R262 Difficulty in walking, not elsewhere classified: Secondary | ICD-10-CM | POA: Diagnosis not present

## 2018-09-08 DIAGNOSIS — R262 Difficulty in walking, not elsewhere classified: Secondary | ICD-10-CM | POA: Diagnosis not present

## 2018-09-08 DIAGNOSIS — M25662 Stiffness of left knee, not elsewhere classified: Secondary | ICD-10-CM | POA: Diagnosis not present

## 2018-09-08 DIAGNOSIS — Z471 Aftercare following joint replacement surgery: Secondary | ICD-10-CM | POA: Diagnosis not present

## 2018-09-08 DIAGNOSIS — M1712 Unilateral primary osteoarthritis, left knee: Secondary | ICD-10-CM | POA: Diagnosis not present

## 2018-09-12 DIAGNOSIS — M1712 Unilateral primary osteoarthritis, left knee: Secondary | ICD-10-CM | POA: Diagnosis not present

## 2018-09-14 DIAGNOSIS — R262 Difficulty in walking, not elsewhere classified: Secondary | ICD-10-CM | POA: Diagnosis not present

## 2018-09-14 DIAGNOSIS — M25662 Stiffness of left knee, not elsewhere classified: Secondary | ICD-10-CM | POA: Diagnosis not present

## 2018-09-14 DIAGNOSIS — M1712 Unilateral primary osteoarthritis, left knee: Secondary | ICD-10-CM | POA: Diagnosis not present

## 2018-09-14 DIAGNOSIS — Z471 Aftercare following joint replacement surgery: Secondary | ICD-10-CM | POA: Diagnosis not present

## 2018-09-16 DIAGNOSIS — R262 Difficulty in walking, not elsewhere classified: Secondary | ICD-10-CM | POA: Diagnosis not present

## 2018-09-16 DIAGNOSIS — Z471 Aftercare following joint replacement surgery: Secondary | ICD-10-CM | POA: Diagnosis not present

## 2018-09-16 DIAGNOSIS — M25662 Stiffness of left knee, not elsewhere classified: Secondary | ICD-10-CM | POA: Diagnosis not present

## 2018-09-16 DIAGNOSIS — M1712 Unilateral primary osteoarthritis, left knee: Secondary | ICD-10-CM | POA: Diagnosis not present

## 2018-09-22 DIAGNOSIS — R262 Difficulty in walking, not elsewhere classified: Secondary | ICD-10-CM | POA: Diagnosis not present

## 2018-09-22 DIAGNOSIS — M25662 Stiffness of left knee, not elsewhere classified: Secondary | ICD-10-CM | POA: Diagnosis not present

## 2018-09-22 DIAGNOSIS — M1712 Unilateral primary osteoarthritis, left knee: Secondary | ICD-10-CM | POA: Diagnosis not present

## 2018-09-22 DIAGNOSIS — Z471 Aftercare following joint replacement surgery: Secondary | ICD-10-CM | POA: Diagnosis not present

## 2018-10-04 DIAGNOSIS — R262 Difficulty in walking, not elsewhere classified: Secondary | ICD-10-CM | POA: Diagnosis not present

## 2018-10-04 DIAGNOSIS — M25662 Stiffness of left knee, not elsewhere classified: Secondary | ICD-10-CM | POA: Diagnosis not present

## 2018-10-04 DIAGNOSIS — Z471 Aftercare following joint replacement surgery: Secondary | ICD-10-CM | POA: Diagnosis not present

## 2018-10-04 DIAGNOSIS — M1712 Unilateral primary osteoarthritis, left knee: Secondary | ICD-10-CM | POA: Diagnosis not present

## 2018-10-13 IMAGING — MR MR CERVICAL SPINE WO/W CM
6 of 8 series · 29 of 48 positions shown · IV contrast (multihance)
Comparison: Multiple exams, including 11/03/2016 MRI

CLINICAL DATA: Pain in the neck, shoulders, and right arm weakness.

EXAM:
MRI CERVICAL SPINE WITHOUT AND WITH CONTRAST
TECHNIQUE: Multiplanar and multiecho pulse sequences of the cervical spine, to
include the craniocervical junction and cervicothoracic junction,
were obtained without and with intravenous contrast.
CONTRAST:  20mL MULTIHANCE GADOBENATE DIMEGLUMINE 529 MG/ML IV SOLN

[Series 4: T1 · sagittal · 3.0mm · 0.39mm/px · 4 of 13 slices shown (1 of 2)]
[im 1/13]
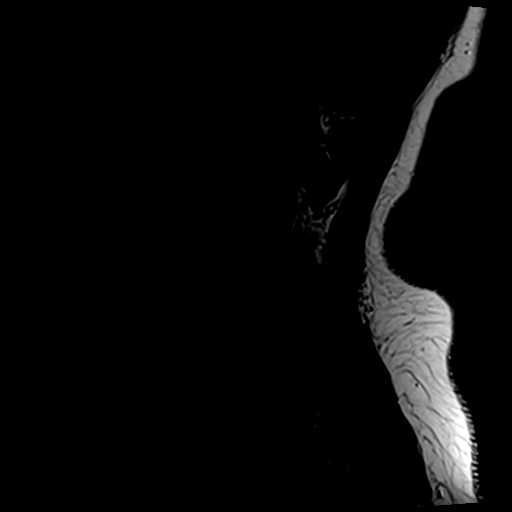
[im 5/13]
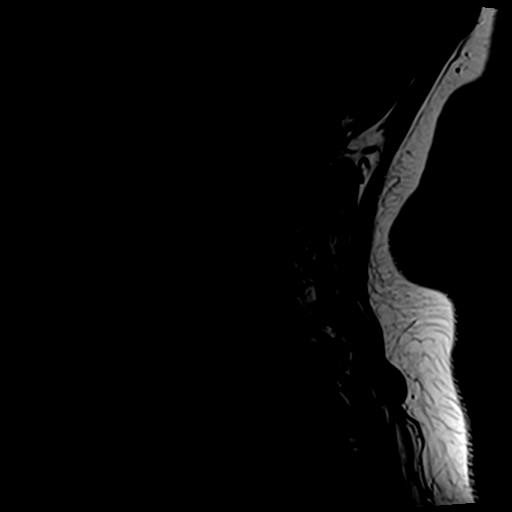
[im 9/13]
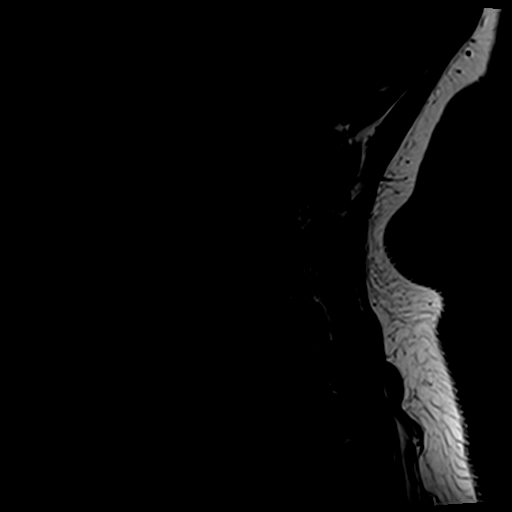
[im 13/13]
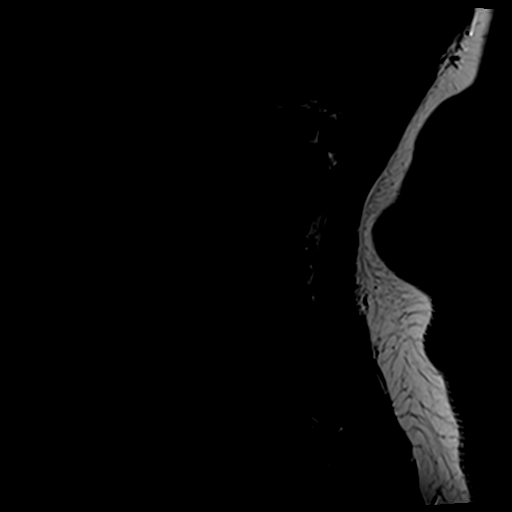

[Series 5: T2 · sagittal · 3.0mm · 0.62mm/px · 4 of 13 slices shown (1 of 2)]
[im 1/13]
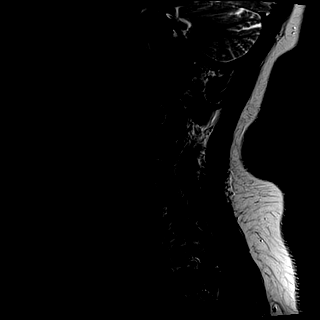
[im 5/13]
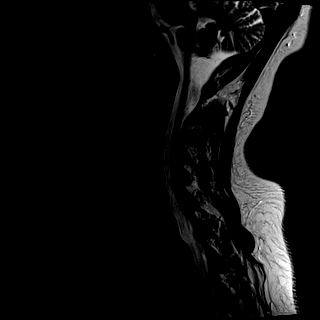
[im 9/13]
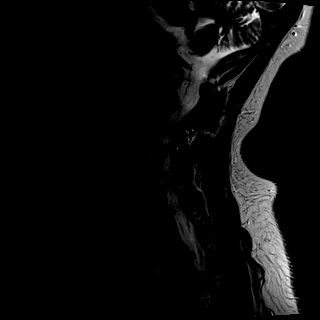
[im 13/13]
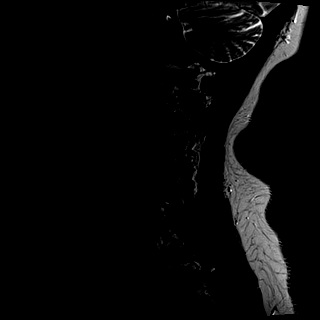

[Series 7: T2 · axial · 3.5mm · 0.74mm/px · z∈[-101,+12]mm · 8 of 26 slices shown (2 of 2)]
[im 1/26]
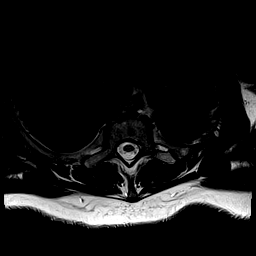
[im 4/26]
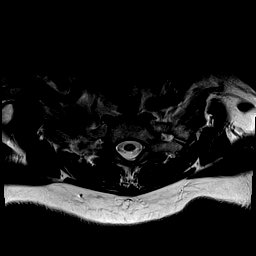
[im 8/26]
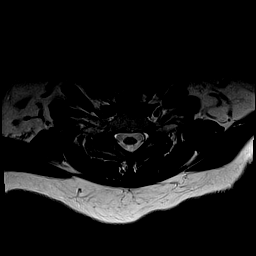
[im 11/26]
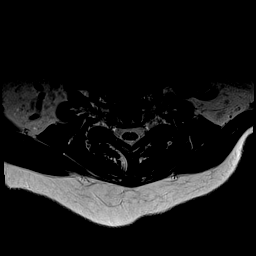
[im 15/26]
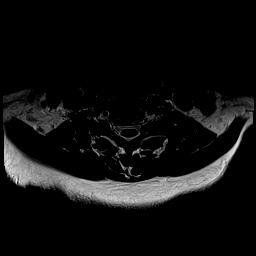
[im 18/26]
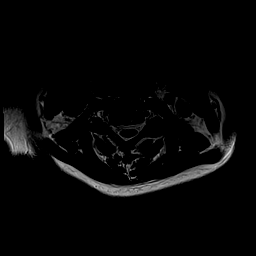
[im 22/26]
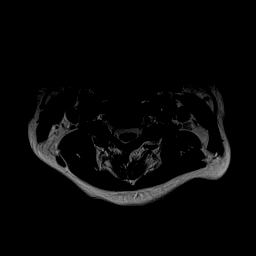
[im 26/26]
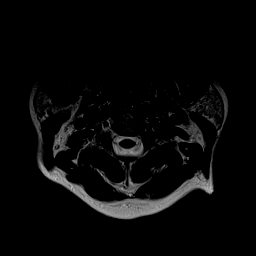

[Series 8: T1 · axial · 3.5mm · 0.37mm/px · z∈[-101,+12]mm · 8 of 26 slices shown (2 of 2)]
[im 1/26]
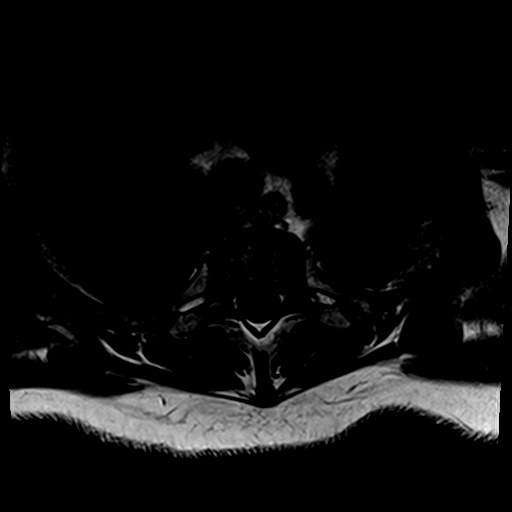
[im 4/26]
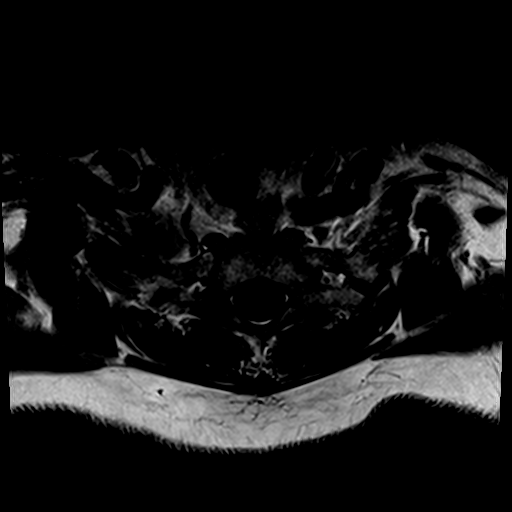
[im 8/26]
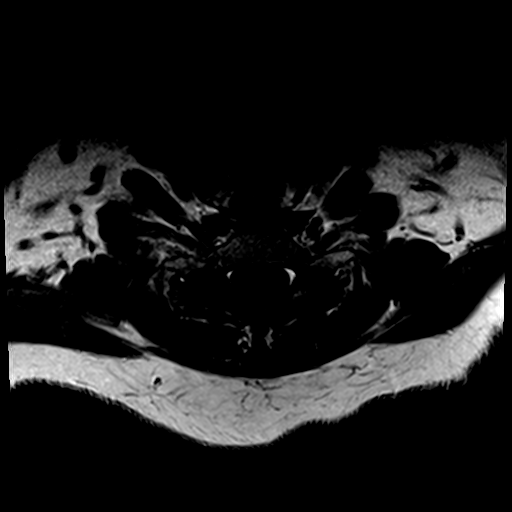
[im 11/26]
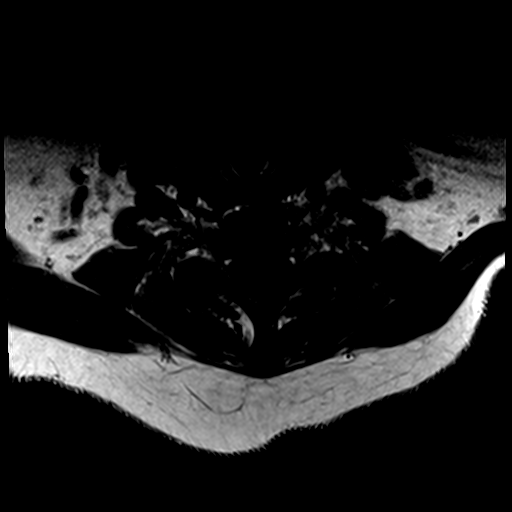
[im 15/26]
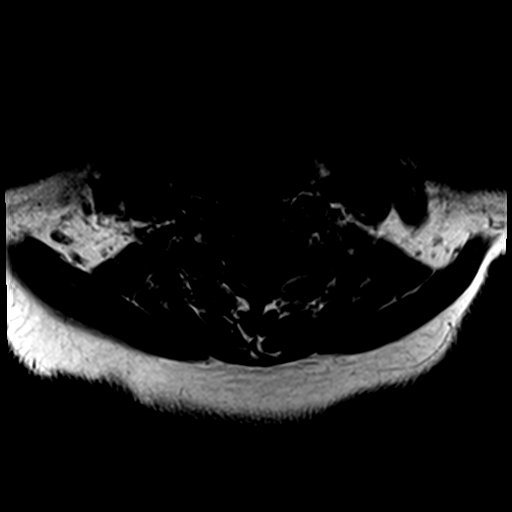
[im 18/26]
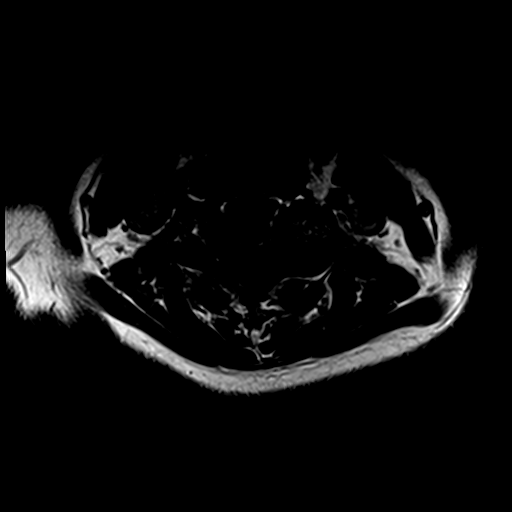
[im 22/26]
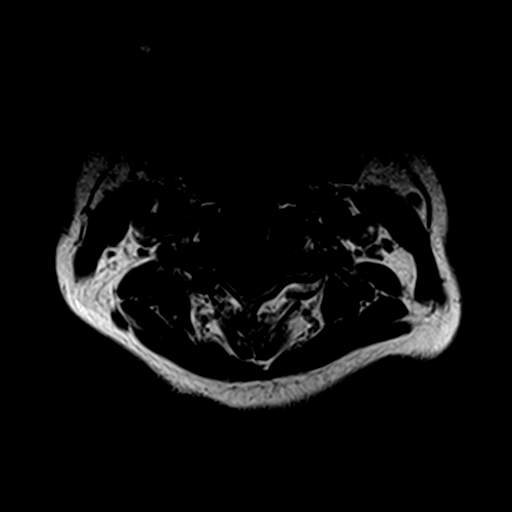
[im 26/26]
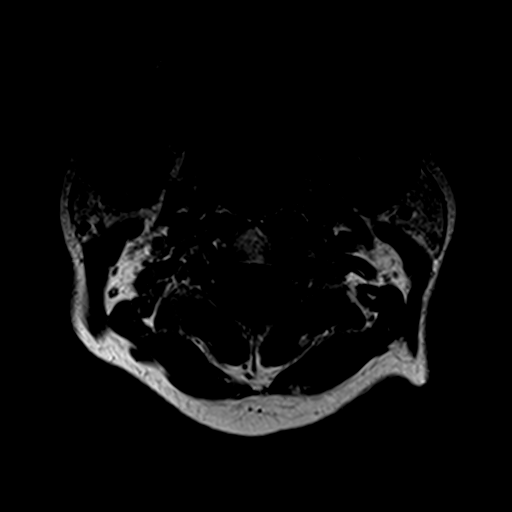

[Series 9: T1 fat-sat post-contrast · sagittal · 3.0mm · 0.78mm/px · 4 of 13 slices shown]
[im 1/13]
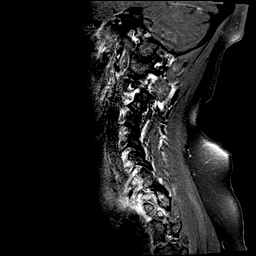
[im 5/13]
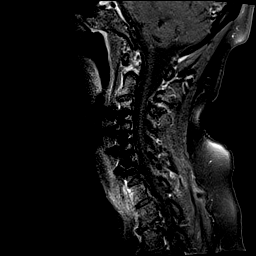
[im 9/13]
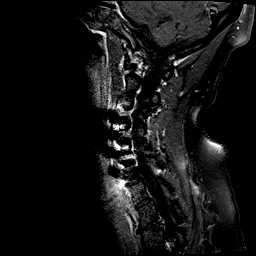
[im 13/13]
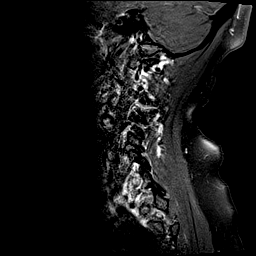

[Series 10: T1 post-contrast · axial · 3.5mm · 0.37mm/px · 1 of 26 slices shown]
[im 1/26]
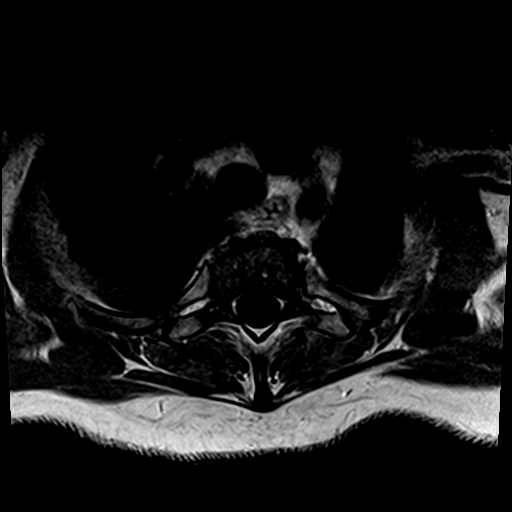

[29 of 48 positions shown; findings below may reference images not displayed]

FINDINGS: Alignment: No vertebral subluxation is observed.

Vertebrae: ACDF at C3-C4-C5-C6 without appreciable complicating
feature. No significant vertebral marrow edema is identified.

Cord: No significant abnormal spinal cord signal is observed.

Posterior Fossa, vertebral arteries, paraspinal tissues: We
partially image the thyroid gland which appears mildly prominent.

Disc levels:

C2-3:  No impingement.  Left uncinate and facet spurring.

C3-4: Mild right foraminal stenosis due to uncinate and facet
spurring, but significantly improved compared prior.

C4-5: Mild right foraminal stenosis due to uncinate spurring but
considerably improved compared to prior. No significant residual
left foraminal impingement.

C5-6: Mild to moderate right and mild left foraminal stenosis,
foraminal impingement is minimally improved on the right compared to
prior.

C6-7:  No impingement.  Left facet arthropathy.

C7-T1:  No impingement.  Right greater than left facet arthropathy.

T1-2: Moderate bilateral foraminal stenosis, stable, due to diffuse
disc bulge, uncinate spurring, and facet arthropathy.
IMPRESSION: 1. Interval ACDF at C3-C4-C5-C6, with improvement of impingement at
these levels. There is some residual spondylosis, and degenerative
disc disease at the unfused levels, contributing to moderate
impingement at T1- 2; mild to moderate impingement at C5-6 ; and
mild impingement at C3-4 and C4-5, as detailed above.
2. Mild prominence of the visualized portion of the contour of the
thyroid gland.

## 2018-10-17 DIAGNOSIS — M25662 Stiffness of left knee, not elsewhere classified: Secondary | ICD-10-CM | POA: Diagnosis not present

## 2018-10-17 DIAGNOSIS — Z471 Aftercare following joint replacement surgery: Secondary | ICD-10-CM | POA: Diagnosis not present

## 2018-10-17 DIAGNOSIS — M1712 Unilateral primary osteoarthritis, left knee: Secondary | ICD-10-CM | POA: Diagnosis not present

## 2018-10-17 DIAGNOSIS — R262 Difficulty in walking, not elsewhere classified: Secondary | ICD-10-CM | POA: Diagnosis not present

## 2018-11-06 DIAGNOSIS — I10 Essential (primary) hypertension: Secondary | ICD-10-CM | POA: Diagnosis not present

## 2018-11-06 DIAGNOSIS — K5790 Diverticulosis of intestine, part unspecified, without perforation or abscess without bleeding: Secondary | ICD-10-CM | POA: Diagnosis not present

## 2018-11-06 DIAGNOSIS — Z7984 Long term (current) use of oral hypoglycemic drugs: Secondary | ICD-10-CM | POA: Diagnosis not present

## 2018-11-06 DIAGNOSIS — R112 Nausea with vomiting, unspecified: Secondary | ICD-10-CM | POA: Diagnosis not present

## 2018-11-06 DIAGNOSIS — K5730 Diverticulosis of large intestine without perforation or abscess without bleeding: Secondary | ICD-10-CM | POA: Diagnosis not present

## 2018-11-06 DIAGNOSIS — E139 Other specified diabetes mellitus without complications: Secondary | ICD-10-CM | POA: Diagnosis not present

## 2018-11-06 DIAGNOSIS — Z79899 Other long term (current) drug therapy: Secondary | ICD-10-CM | POA: Diagnosis not present

## 2018-11-06 DIAGNOSIS — K5720 Diverticulitis of large intestine with perforation and abscess without bleeding: Secondary | ICD-10-CM | POA: Diagnosis not present

## 2018-11-07 DIAGNOSIS — K5720 Diverticulitis of large intestine with perforation and abscess without bleeding: Secondary | ICD-10-CM | POA: Diagnosis not present

## 2018-11-07 DIAGNOSIS — E139 Other specified diabetes mellitus without complications: Secondary | ICD-10-CM | POA: Diagnosis not present

## 2018-11-08 DIAGNOSIS — K5720 Diverticulitis of large intestine with perforation and abscess without bleeding: Secondary | ICD-10-CM | POA: Diagnosis not present

## 2018-11-08 DIAGNOSIS — E139 Other specified diabetes mellitus without complications: Secondary | ICD-10-CM | POA: Diagnosis not present

## 2018-11-09 DIAGNOSIS — E139 Other specified diabetes mellitus without complications: Secondary | ICD-10-CM | POA: Diagnosis not present

## 2018-11-09 DIAGNOSIS — K5720 Diverticulitis of large intestine with perforation and abscess without bleeding: Secondary | ICD-10-CM | POA: Diagnosis not present

## 2018-11-10 DIAGNOSIS — K5720 Diverticulitis of large intestine with perforation and abscess without bleeding: Secondary | ICD-10-CM | POA: Diagnosis not present

## 2018-11-10 DIAGNOSIS — E139 Other specified diabetes mellitus without complications: Secondary | ICD-10-CM | POA: Diagnosis not present

## 2018-11-11 DIAGNOSIS — K5730 Diverticulosis of large intestine without perforation or abscess without bleeding: Secondary | ICD-10-CM | POA: Diagnosis not present

## 2018-11-21 DIAGNOSIS — R7301 Impaired fasting glucose: Secondary | ICD-10-CM | POA: Diagnosis not present

## 2018-11-21 DIAGNOSIS — I10 Essential (primary) hypertension: Secondary | ICD-10-CM | POA: Diagnosis not present

## 2018-11-21 DIAGNOSIS — K219 Gastro-esophageal reflux disease without esophagitis: Secondary | ICD-10-CM | POA: Diagnosis not present

## 2018-11-21 DIAGNOSIS — K5720 Diverticulitis of large intestine with perforation and abscess without bleeding: Secondary | ICD-10-CM | POA: Diagnosis not present

## 2018-11-21 DIAGNOSIS — E119 Type 2 diabetes mellitus without complications: Secondary | ICD-10-CM | POA: Diagnosis not present

## 2018-11-21 DIAGNOSIS — Z6838 Body mass index (BMI) 38.0-38.9, adult: Secondary | ICD-10-CM | POA: Diagnosis not present

## 2018-12-05 DIAGNOSIS — M1712 Unilateral primary osteoarthritis, left knee: Secondary | ICD-10-CM | POA: Diagnosis not present

## 2018-12-10 DIAGNOSIS — I10 Essential (primary) hypertension: Secondary | ICD-10-CM | POA: Diagnosis not present

## 2018-12-10 DIAGNOSIS — R7301 Impaired fasting glucose: Secondary | ICD-10-CM | POA: Diagnosis not present

## 2018-12-10 DIAGNOSIS — K219 Gastro-esophageal reflux disease without esophagitis: Secondary | ICD-10-CM | POA: Diagnosis not present

## 2018-12-12 DIAGNOSIS — Z471 Aftercare following joint replacement surgery: Secondary | ICD-10-CM | POA: Diagnosis not present

## 2018-12-12 DIAGNOSIS — M25662 Stiffness of left knee, not elsewhere classified: Secondary | ICD-10-CM | POA: Diagnosis not present

## 2018-12-12 DIAGNOSIS — M1712 Unilateral primary osteoarthritis, left knee: Secondary | ICD-10-CM | POA: Diagnosis not present

## 2018-12-12 DIAGNOSIS — R262 Difficulty in walking, not elsewhere classified: Secondary | ICD-10-CM | POA: Diagnosis not present

## 2018-12-15 DIAGNOSIS — M1712 Unilateral primary osteoarthritis, left knee: Secondary | ICD-10-CM | POA: Diagnosis not present

## 2018-12-15 DIAGNOSIS — M25662 Stiffness of left knee, not elsewhere classified: Secondary | ICD-10-CM | POA: Diagnosis not present

## 2018-12-15 DIAGNOSIS — R262 Difficulty in walking, not elsewhere classified: Secondary | ICD-10-CM | POA: Diagnosis not present

## 2018-12-15 DIAGNOSIS — Z471 Aftercare following joint replacement surgery: Secondary | ICD-10-CM | POA: Diagnosis not present

## 2018-12-22 DIAGNOSIS — M25662 Stiffness of left knee, not elsewhere classified: Secondary | ICD-10-CM | POA: Diagnosis not present

## 2018-12-22 DIAGNOSIS — Z471 Aftercare following joint replacement surgery: Secondary | ICD-10-CM | POA: Diagnosis not present

## 2018-12-22 DIAGNOSIS — R262 Difficulty in walking, not elsewhere classified: Secondary | ICD-10-CM | POA: Diagnosis not present

## 2018-12-22 DIAGNOSIS — M1712 Unilateral primary osteoarthritis, left knee: Secondary | ICD-10-CM | POA: Diagnosis not present

## 2018-12-26 DIAGNOSIS — M1712 Unilateral primary osteoarthritis, left knee: Secondary | ICD-10-CM | POA: Diagnosis not present

## 2018-12-26 DIAGNOSIS — R262 Difficulty in walking, not elsewhere classified: Secondary | ICD-10-CM | POA: Diagnosis not present

## 2018-12-26 DIAGNOSIS — Z471 Aftercare following joint replacement surgery: Secondary | ICD-10-CM | POA: Diagnosis not present

## 2018-12-26 DIAGNOSIS — M25662 Stiffness of left knee, not elsewhere classified: Secondary | ICD-10-CM | POA: Diagnosis not present

## 2018-12-29 DIAGNOSIS — K5731 Diverticulosis of large intestine without perforation or abscess with bleeding: Secondary | ICD-10-CM | POA: Diagnosis not present

## 2018-12-29 DIAGNOSIS — M1990 Unspecified osteoarthritis, unspecified site: Secondary | ICD-10-CM | POA: Diagnosis not present

## 2018-12-29 DIAGNOSIS — Z886 Allergy status to analgesic agent status: Secondary | ICD-10-CM | POA: Diagnosis not present

## 2018-12-29 DIAGNOSIS — K5730 Diverticulosis of large intestine without perforation or abscess without bleeding: Secondary | ICD-10-CM | POA: Diagnosis not present

## 2018-12-29 DIAGNOSIS — K219 Gastro-esophageal reflux disease without esophagitis: Secondary | ICD-10-CM | POA: Diagnosis not present

## 2018-12-29 DIAGNOSIS — Z09 Encounter for follow-up examination after completed treatment for conditions other than malignant neoplasm: Secondary | ICD-10-CM | POA: Diagnosis not present

## 2018-12-29 DIAGNOSIS — E119 Type 2 diabetes mellitus without complications: Secondary | ICD-10-CM | POA: Diagnosis not present

## 2018-12-29 DIAGNOSIS — I10 Essential (primary) hypertension: Secondary | ICD-10-CM | POA: Diagnosis not present

## 2018-12-29 DIAGNOSIS — K5732 Diverticulitis of large intestine without perforation or abscess without bleeding: Secondary | ICD-10-CM | POA: Diagnosis not present

## 2018-12-29 DIAGNOSIS — K5792 Diverticulitis of intestine, part unspecified, without perforation or abscess without bleeding: Secondary | ICD-10-CM | POA: Diagnosis not present

## 2018-12-30 DIAGNOSIS — Z471 Aftercare following joint replacement surgery: Secondary | ICD-10-CM | POA: Diagnosis not present

## 2018-12-30 DIAGNOSIS — R262 Difficulty in walking, not elsewhere classified: Secondary | ICD-10-CM | POA: Diagnosis not present

## 2018-12-30 DIAGNOSIS — M1712 Unilateral primary osteoarthritis, left knee: Secondary | ICD-10-CM | POA: Diagnosis not present

## 2018-12-30 DIAGNOSIS — M25662 Stiffness of left knee, not elsewhere classified: Secondary | ICD-10-CM | POA: Diagnosis not present

## 2019-01-02 DIAGNOSIS — M25662 Stiffness of left knee, not elsewhere classified: Secondary | ICD-10-CM | POA: Diagnosis not present

## 2019-01-02 DIAGNOSIS — R262 Difficulty in walking, not elsewhere classified: Secondary | ICD-10-CM | POA: Diagnosis not present

## 2019-01-02 DIAGNOSIS — Z471 Aftercare following joint replacement surgery: Secondary | ICD-10-CM | POA: Diagnosis not present

## 2019-01-02 DIAGNOSIS — M1712 Unilateral primary osteoarthritis, left knee: Secondary | ICD-10-CM | POA: Diagnosis not present

## 2019-01-11 DIAGNOSIS — J069 Acute upper respiratory infection, unspecified: Secondary | ICD-10-CM | POA: Diagnosis not present

## 2019-01-11 DIAGNOSIS — J209 Acute bronchitis, unspecified: Secondary | ICD-10-CM | POA: Diagnosis not present

## 2019-01-11 DIAGNOSIS — J0101 Acute recurrent maxillary sinusitis: Secondary | ICD-10-CM | POA: Diagnosis not present

## 2019-01-11 DIAGNOSIS — H66001 Acute suppurative otitis media without spontaneous rupture of ear drum, right ear: Secondary | ICD-10-CM | POA: Diagnosis not present

## 2019-01-16 DIAGNOSIS — M25662 Stiffness of left knee, not elsewhere classified: Secondary | ICD-10-CM | POA: Diagnosis not present

## 2019-01-16 DIAGNOSIS — M1712 Unilateral primary osteoarthritis, left knee: Secondary | ICD-10-CM | POA: Diagnosis not present

## 2019-01-16 DIAGNOSIS — Z471 Aftercare following joint replacement surgery: Secondary | ICD-10-CM | POA: Diagnosis not present

## 2019-01-16 DIAGNOSIS — R262 Difficulty in walking, not elsewhere classified: Secondary | ICD-10-CM | POA: Diagnosis not present

## 2019-01-24 DIAGNOSIS — M25662 Stiffness of left knee, not elsewhere classified: Secondary | ICD-10-CM | POA: Diagnosis not present

## 2019-01-24 DIAGNOSIS — Z471 Aftercare following joint replacement surgery: Secondary | ICD-10-CM | POA: Diagnosis not present

## 2019-01-24 DIAGNOSIS — R262 Difficulty in walking, not elsewhere classified: Secondary | ICD-10-CM | POA: Diagnosis not present

## 2019-01-24 DIAGNOSIS — M1712 Unilateral primary osteoarthritis, left knee: Secondary | ICD-10-CM | POA: Diagnosis not present

## 2019-01-30 DIAGNOSIS — Z471 Aftercare following joint replacement surgery: Secondary | ICD-10-CM | POA: Diagnosis not present

## 2019-01-30 DIAGNOSIS — R262 Difficulty in walking, not elsewhere classified: Secondary | ICD-10-CM | POA: Diagnosis not present

## 2019-01-30 DIAGNOSIS — M25662 Stiffness of left knee, not elsewhere classified: Secondary | ICD-10-CM | POA: Diagnosis not present

## 2019-01-30 DIAGNOSIS — M1712 Unilateral primary osteoarthritis, left knee: Secondary | ICD-10-CM | POA: Diagnosis not present

## 2019-02-09 DIAGNOSIS — M25662 Stiffness of left knee, not elsewhere classified: Secondary | ICD-10-CM | POA: Diagnosis not present

## 2019-02-09 DIAGNOSIS — Z471 Aftercare following joint replacement surgery: Secondary | ICD-10-CM | POA: Diagnosis not present

## 2019-02-09 DIAGNOSIS — M1712 Unilateral primary osteoarthritis, left knee: Secondary | ICD-10-CM | POA: Diagnosis not present

## 2019-02-09 DIAGNOSIS — R262 Difficulty in walking, not elsewhere classified: Secondary | ICD-10-CM | POA: Diagnosis not present

## 2019-02-13 DIAGNOSIS — R262 Difficulty in walking, not elsewhere classified: Secondary | ICD-10-CM | POA: Diagnosis not present

## 2019-02-13 DIAGNOSIS — M25662 Stiffness of left knee, not elsewhere classified: Secondary | ICD-10-CM | POA: Diagnosis not present

## 2019-02-13 DIAGNOSIS — Z471 Aftercare following joint replacement surgery: Secondary | ICD-10-CM | POA: Diagnosis not present

## 2019-02-13 DIAGNOSIS — M1712 Unilateral primary osteoarthritis, left knee: Secondary | ICD-10-CM | POA: Diagnosis not present

## 2019-02-20 DIAGNOSIS — M25662 Stiffness of left knee, not elsewhere classified: Secondary | ICD-10-CM | POA: Diagnosis not present

## 2019-02-20 DIAGNOSIS — R262 Difficulty in walking, not elsewhere classified: Secondary | ICD-10-CM | POA: Diagnosis not present

## 2019-02-20 DIAGNOSIS — M1712 Unilateral primary osteoarthritis, left knee: Secondary | ICD-10-CM | POA: Diagnosis not present

## 2019-02-20 DIAGNOSIS — Z471 Aftercare following joint replacement surgery: Secondary | ICD-10-CM | POA: Diagnosis not present

## 2019-03-06 DIAGNOSIS — M25512 Pain in left shoulder: Secondary | ICD-10-CM | POA: Diagnosis not present

## 2019-03-06 DIAGNOSIS — Z96652 Presence of left artificial knee joint: Secondary | ICD-10-CM | POA: Diagnosis not present

## 2019-03-21 DIAGNOSIS — I10 Essential (primary) hypertension: Secondary | ICD-10-CM | POA: Diagnosis not present

## 2019-03-21 DIAGNOSIS — Z1389 Encounter for screening for other disorder: Secondary | ICD-10-CM | POA: Diagnosis not present

## 2019-03-21 DIAGNOSIS — E1165 Type 2 diabetes mellitus with hyperglycemia: Secondary | ICD-10-CM | POA: Diagnosis not present

## 2019-03-21 DIAGNOSIS — M542 Cervicalgia: Secondary | ICD-10-CM | POA: Diagnosis not present

## 2019-03-21 DIAGNOSIS — K219 Gastro-esophageal reflux disease without esophagitis: Secondary | ICD-10-CM | POA: Diagnosis not present

## 2019-03-21 DIAGNOSIS — K210 Gastro-esophageal reflux disease with esophagitis: Secondary | ICD-10-CM | POA: Diagnosis not present

## 2019-04-17 DIAGNOSIS — M25551 Pain in right hip: Secondary | ICD-10-CM | POA: Diagnosis not present

## 2019-04-17 DIAGNOSIS — M545 Low back pain: Secondary | ICD-10-CM | POA: Diagnosis not present

## 2019-04-17 DIAGNOSIS — M542 Cervicalgia: Secondary | ICD-10-CM | POA: Diagnosis not present

## 2019-04-26 DIAGNOSIS — H66002 Acute suppurative otitis media without spontaneous rupture of ear drum, left ear: Secondary | ICD-10-CM | POA: Diagnosis not present

## 2019-04-26 DIAGNOSIS — H6591 Unspecified nonsuppurative otitis media, right ear: Secondary | ICD-10-CM | POA: Diagnosis not present

## 2019-05-15 DIAGNOSIS — M25512 Pain in left shoulder: Secondary | ICD-10-CM | POA: Diagnosis not present

## 2019-05-23 DIAGNOSIS — M25512 Pain in left shoulder: Secondary | ICD-10-CM | POA: Diagnosis not present

## 2019-05-26 DIAGNOSIS — M25512 Pain in left shoulder: Secondary | ICD-10-CM | POA: Diagnosis not present

## 2019-06-23 DIAGNOSIS — M542 Cervicalgia: Secondary | ICD-10-CM | POA: Diagnosis not present

## 2019-06-23 DIAGNOSIS — M25512 Pain in left shoulder: Secondary | ICD-10-CM | POA: Diagnosis not present

## 2019-07-11 DIAGNOSIS — R7301 Impaired fasting glucose: Secondary | ICD-10-CM | POA: Diagnosis not present

## 2019-07-11 DIAGNOSIS — K210 Gastro-esophageal reflux disease with esophagitis: Secondary | ICD-10-CM | POA: Diagnosis not present

## 2019-07-11 DIAGNOSIS — I10 Essential (primary) hypertension: Secondary | ICD-10-CM | POA: Diagnosis not present

## 2019-07-11 DIAGNOSIS — R5383 Other fatigue: Secondary | ICD-10-CM | POA: Diagnosis not present

## 2019-07-11 DIAGNOSIS — E1165 Type 2 diabetes mellitus with hyperglycemia: Secondary | ICD-10-CM | POA: Diagnosis not present

## 2019-07-19 DIAGNOSIS — I10 Essential (primary) hypertension: Secondary | ICD-10-CM | POA: Diagnosis not present

## 2019-07-19 DIAGNOSIS — K219 Gastro-esophageal reflux disease without esophagitis: Secondary | ICD-10-CM | POA: Diagnosis not present

## 2019-07-19 DIAGNOSIS — K210 Gastro-esophageal reflux disease with esophagitis: Secondary | ICD-10-CM | POA: Diagnosis not present

## 2019-07-19 DIAGNOSIS — M542 Cervicalgia: Secondary | ICD-10-CM | POA: Diagnosis not present

## 2019-08-18 DIAGNOSIS — M25512 Pain in left shoulder: Secondary | ICD-10-CM | POA: Diagnosis not present

## 2019-08-18 DIAGNOSIS — M542 Cervicalgia: Secondary | ICD-10-CM | POA: Diagnosis not present

## 2019-09-06 DIAGNOSIS — K5732 Diverticulitis of large intestine without perforation or abscess without bleeding: Secondary | ICD-10-CM | POA: Diagnosis not present

## 2019-09-08 DIAGNOSIS — Z885 Allergy status to narcotic agent status: Secondary | ICD-10-CM | POA: Diagnosis not present

## 2019-09-08 DIAGNOSIS — K5900 Constipation, unspecified: Secondary | ICD-10-CM | POA: Diagnosis not present

## 2019-09-08 DIAGNOSIS — E119 Type 2 diabetes mellitus without complications: Secondary | ICD-10-CM | POA: Diagnosis not present

## 2019-09-08 DIAGNOSIS — K760 Fatty (change of) liver, not elsewhere classified: Secondary | ICD-10-CM | POA: Diagnosis not present

## 2019-09-08 DIAGNOSIS — I10 Essential (primary) hypertension: Secondary | ICD-10-CM | POA: Diagnosis not present

## 2019-09-08 DIAGNOSIS — Z79899 Other long term (current) drug therapy: Secondary | ICD-10-CM | POA: Diagnosis not present

## 2019-09-08 DIAGNOSIS — K5730 Diverticulosis of large intestine without perforation or abscess without bleeding: Secondary | ICD-10-CM | POA: Diagnosis not present

## 2019-09-08 DIAGNOSIS — R1032 Left lower quadrant pain: Secondary | ICD-10-CM | POA: Diagnosis not present

## 2019-09-08 DIAGNOSIS — K219 Gastro-esophageal reflux disease without esophagitis: Secondary | ICD-10-CM | POA: Diagnosis not present

## 2019-09-08 DIAGNOSIS — Z7984 Long term (current) use of oral hypoglycemic drugs: Secondary | ICD-10-CM | POA: Diagnosis not present

## 2019-09-25 DIAGNOSIS — Z23 Encounter for immunization: Secondary | ICD-10-CM | POA: Diagnosis not present

## 2019-09-29 DIAGNOSIS — M25512 Pain in left shoulder: Secondary | ICD-10-CM | POA: Diagnosis not present

## 2019-09-29 DIAGNOSIS — M545 Low back pain: Secondary | ICD-10-CM | POA: Diagnosis not present

## 2019-10-02 DIAGNOSIS — K5732 Diverticulitis of large intestine without perforation or abscess without bleeding: Secondary | ICD-10-CM | POA: Diagnosis not present

## 2019-11-20 DIAGNOSIS — E1165 Type 2 diabetes mellitus with hyperglycemia: Secondary | ICD-10-CM | POA: Diagnosis not present

## 2019-11-20 DIAGNOSIS — R739 Hyperglycemia, unspecified: Secondary | ICD-10-CM | POA: Diagnosis not present

## 2019-11-20 DIAGNOSIS — R7301 Impaired fasting glucose: Secondary | ICD-10-CM | POA: Diagnosis not present

## 2019-11-20 DIAGNOSIS — I10 Essential (primary) hypertension: Secondary | ICD-10-CM | POA: Diagnosis not present

## 2019-11-22 DIAGNOSIS — I10 Essential (primary) hypertension: Secondary | ICD-10-CM | POA: Diagnosis not present

## 2019-11-22 DIAGNOSIS — Z6838 Body mass index (BMI) 38.0-38.9, adult: Secondary | ICD-10-CM | POA: Diagnosis not present

## 2019-11-22 DIAGNOSIS — E1165 Type 2 diabetes mellitus with hyperglycemia: Secondary | ICD-10-CM | POA: Diagnosis not present

## 2019-11-22 DIAGNOSIS — M542 Cervicalgia: Secondary | ICD-10-CM | POA: Diagnosis not present

## 2019-12-20 DIAGNOSIS — R928 Other abnormal and inconclusive findings on diagnostic imaging of breast: Secondary | ICD-10-CM | POA: Diagnosis not present

## 2020-01-03 DIAGNOSIS — K5732 Diverticulitis of large intestine without perforation or abscess without bleeding: Secondary | ICD-10-CM | POA: Diagnosis not present

## 2020-01-28 IMAGING — CR DG KNEE 1-2V PORT*L*
2 series · 2 of 2 positions shown · non-contrast
Comparison: Preoperative study October 05, 2017

CLINICAL DATA: Status post left total knee joint prosthesis
placement.

EXAM:
PORTABLE LEFT KNEE - 1-2 VIEW

[AP]
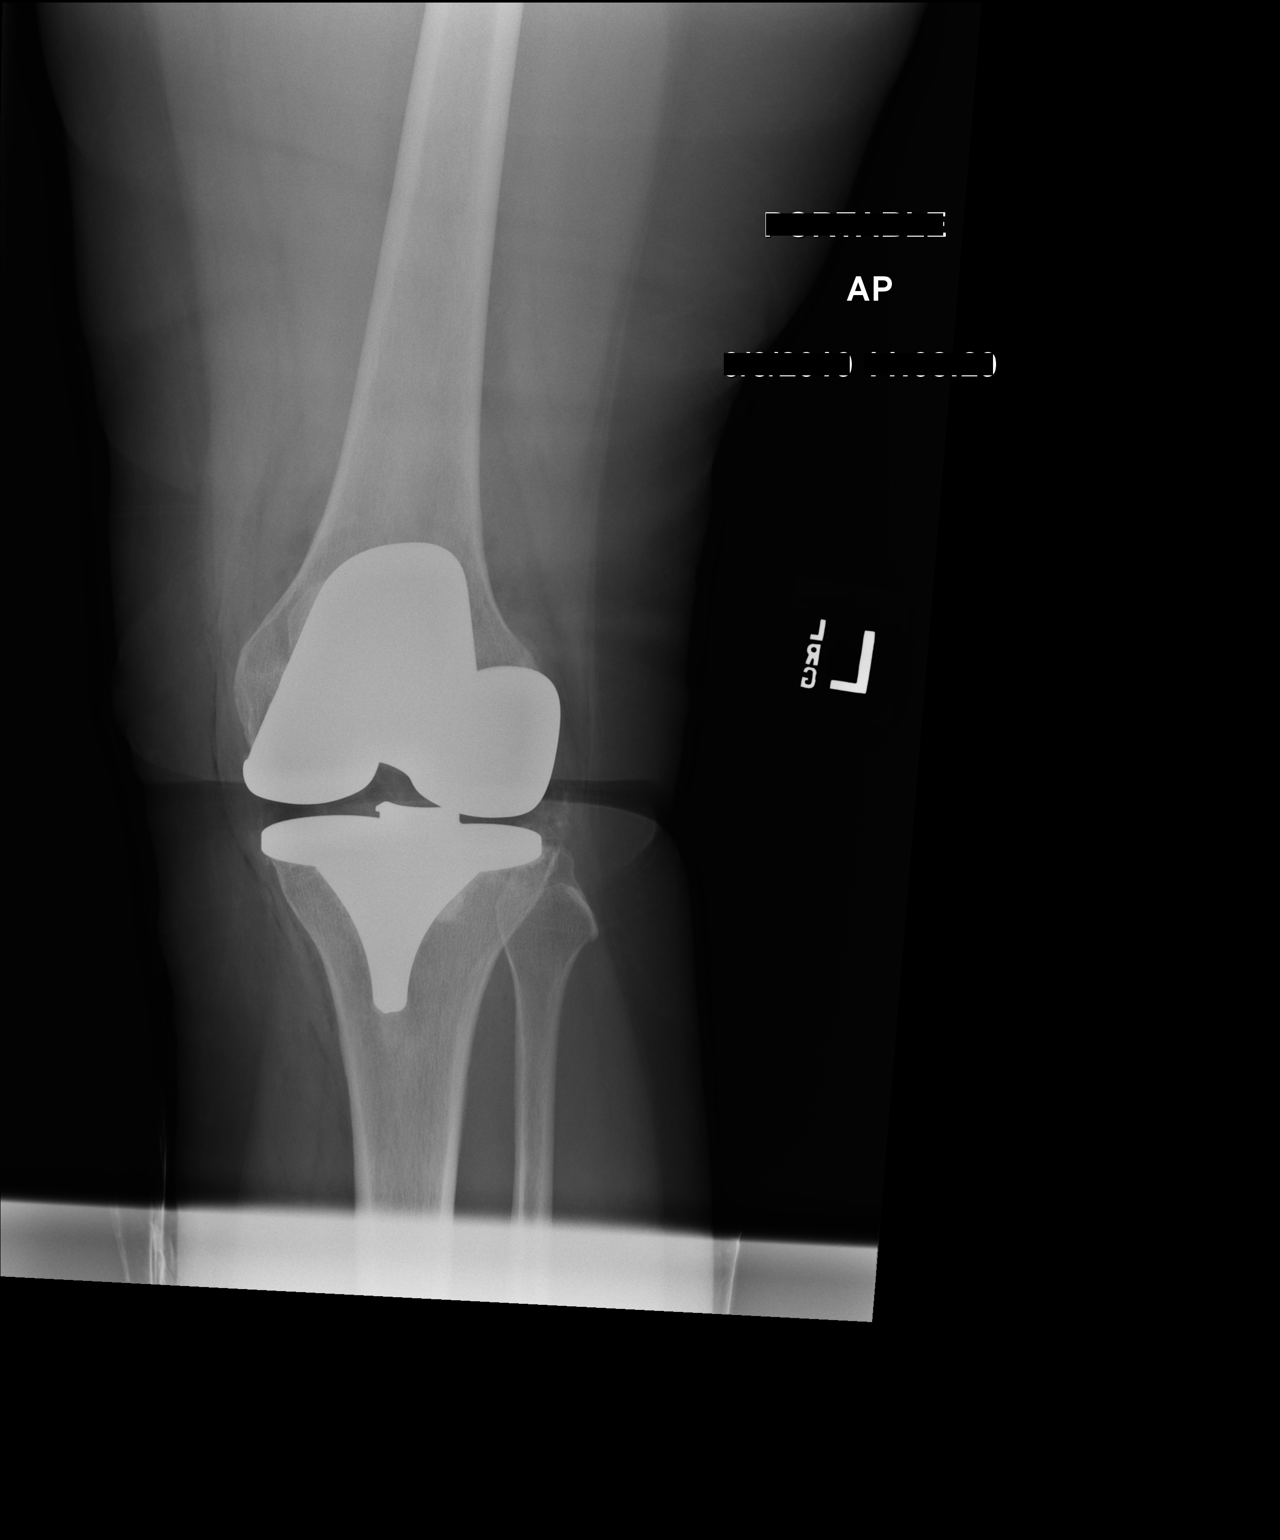

[xtable lateral]
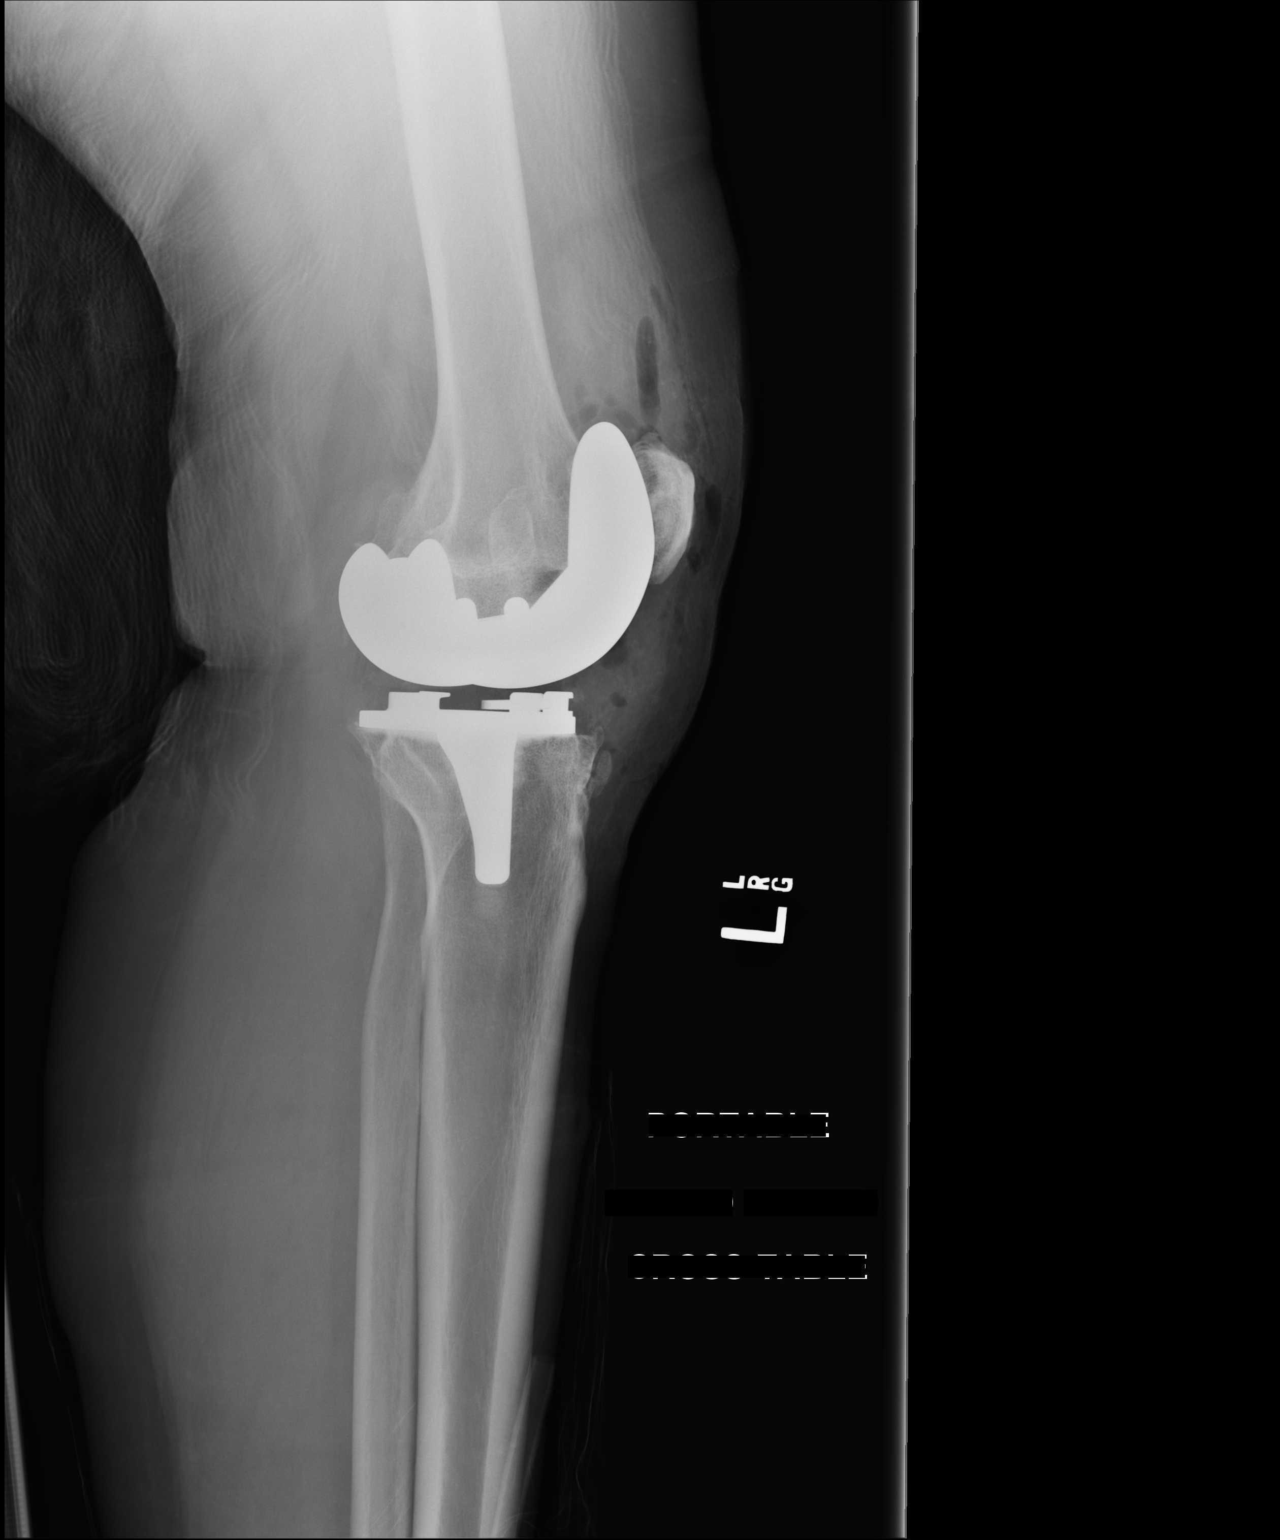

[2 of 2 positions shown; findings below may reference images not displayed]

FINDINGS: The patient has undergone placement of a total knee joint
prosthesis. Radiographic positioning appears good. The interface
with the native bone appears normal. No acute native bone
abnormality is observed. There is small amount of air in fluid
within the anterior aspect of the joint.
IMPRESSION: No immediate complication following left total knee joint prosthesis
placement.

## 2020-02-13 ENCOUNTER — Other Ambulatory Visit: Payer: Self-pay

## 2020-02-13 ENCOUNTER — Ambulatory Visit: Payer: Self-pay | Attending: Internal Medicine

## 2020-02-13 DIAGNOSIS — Z23 Encounter for immunization: Secondary | ICD-10-CM

## 2020-02-13 NOTE — Progress Notes (Signed)
   Covid-19 Vaccination Clinic  Name:  GRAYCEE GREESON    MRN: [id number] DOB: 08-12-1961  02/13/2020  Ms. Betzler was observed post Covid-19 immunization for 15 minutes without incident. She was provided with Vaccine Information Sheet and instruction to access the V-Safe system.   Ms. Inda was instructed to call 911 with any severe reactions post vaccine: Marland Kitchen Difficulty breathing  . Swelling of face and throat  . A fast heartbeat  . A bad rash all over body  . Dizziness and weakness   Immunizations Administered    Name Date Dose VIS Date Route   Moderna COVID-19 Vaccine 02/13/2020  3:37 PM 0.5 mL 10/31/2019 Intramuscular   Manufacturer: Moderna   Lot: [id number]   NDC: 33007-622-63

## 2020-03-18 DIAGNOSIS — K2100 Gastro-esophageal reflux disease with esophagitis, without bleeding: Secondary | ICD-10-CM | POA: Diagnosis not present

## 2020-03-18 DIAGNOSIS — E1165 Type 2 diabetes mellitus with hyperglycemia: Secondary | ICD-10-CM | POA: Diagnosis not present

## 2020-03-18 DIAGNOSIS — Z13220 Encounter for screening for lipoid disorders: Secondary | ICD-10-CM | POA: Diagnosis not present

## 2020-03-18 DIAGNOSIS — I10 Essential (primary) hypertension: Secondary | ICD-10-CM | POA: Diagnosis not present

## 2020-03-20 ENCOUNTER — Ambulatory Visit: Payer: Self-pay | Attending: Internal Medicine

## 2020-03-20 DIAGNOSIS — Z23 Encounter for immunization: Secondary | ICD-10-CM

## 2020-03-20 NOTE — Progress Notes (Signed)
   Covid-19 Vaccination Clinic  Name:  Donna Casey    MRN: [id number] DOB: 06-07-1961  03/20/2020  [name] was observed post Covid-19 immunization for 15 minutes without incident. She was provided with Vaccine Information Sheet and instruction to access the V-Safe system.   [name] was instructed to call 911 with any severe reactions post vaccine: Marland Kitchen Difficulty breathing  . Swelling of face and throat  . A fast heartbeat  . A bad rash all over body  . Dizziness and weakness   Immunizations Administered    Name Date Dose VIS Date Route   Moderna COVID-19 Vaccine 03/20/2020 11:12 AM 0.5 mL 10/2019 Intramuscular   Manufacturer: Moderna   Lot: [id number]   NDC: 63785-885-02

## 2020-03-21 DIAGNOSIS — I10 Essential (primary) hypertension: Secondary | ICD-10-CM | POA: Diagnosis not present

## 2020-03-21 DIAGNOSIS — E1165 Type 2 diabetes mellitus with hyperglycemia: Secondary | ICD-10-CM | POA: Diagnosis not present

## 2020-03-21 DIAGNOSIS — Z1389 Encounter for screening for other disorder: Secondary | ICD-10-CM | POA: Diagnosis not present

## 2020-03-21 DIAGNOSIS — M542 Cervicalgia: Secondary | ICD-10-CM | POA: Diagnosis not present

## 2020-03-21 DIAGNOSIS — Z1331 Encounter for screening for depression: Secondary | ICD-10-CM | POA: Diagnosis not present

## 2020-03-21 DIAGNOSIS — Z6837 Body mass index (BMI) 37.0-37.9, adult: Secondary | ICD-10-CM | POA: Diagnosis not present

## 2020-07-08 DIAGNOSIS — R300 Dysuria: Secondary | ICD-10-CM | POA: Diagnosis not present

## 2020-07-08 DIAGNOSIS — Z6834 Body mass index (BMI) 34.0-34.9, adult: Secondary | ICD-10-CM | POA: Diagnosis not present

## 2020-07-08 DIAGNOSIS — K5732 Diverticulitis of large intestine without perforation or abscess without bleeding: Secondary | ICD-10-CM | POA: Diagnosis not present

## 2020-07-09 DIAGNOSIS — K5730 Diverticulosis of large intestine without perforation or abscess without bleeding: Secondary | ICD-10-CM | POA: Diagnosis not present

## 2020-07-09 DIAGNOSIS — K760 Fatty (change of) liver, not elsewhere classified: Secondary | ICD-10-CM | POA: Diagnosis not present

## 2020-07-09 DIAGNOSIS — K5732 Diverticulitis of large intestine without perforation or abscess without bleeding: Secondary | ICD-10-CM | POA: Diagnosis not present

## 2020-07-22 DIAGNOSIS — I10 Essential (primary) hypertension: Secondary | ICD-10-CM | POA: Diagnosis not present

## 2020-07-22 DIAGNOSIS — Z13220 Encounter for screening for lipoid disorders: Secondary | ICD-10-CM | POA: Diagnosis not present

## 2020-07-22 DIAGNOSIS — E1165 Type 2 diabetes mellitus with hyperglycemia: Secondary | ICD-10-CM | POA: Diagnosis not present

## 2020-07-22 DIAGNOSIS — K5732 Diverticulitis of large intestine without perforation or abscess without bleeding: Secondary | ICD-10-CM | POA: Diagnosis not present

## 2020-07-22 DIAGNOSIS — K2100 Gastro-esophageal reflux disease with esophagitis, without bleeding: Secondary | ICD-10-CM | POA: Diagnosis not present

## 2020-07-25 DIAGNOSIS — M542 Cervicalgia: Secondary | ICD-10-CM | POA: Diagnosis not present

## 2020-07-25 DIAGNOSIS — Z6834 Body mass index (BMI) 34.0-34.9, adult: Secondary | ICD-10-CM | POA: Diagnosis not present

## 2020-07-25 DIAGNOSIS — E1165 Type 2 diabetes mellitus with hyperglycemia: Secondary | ICD-10-CM | POA: Diagnosis not present

## 2020-07-25 DIAGNOSIS — I10 Essential (primary) hypertension: Secondary | ICD-10-CM | POA: Diagnosis not present

## 2020-11-20 DIAGNOSIS — Z13220 Encounter for screening for lipoid disorders: Secondary | ICD-10-CM | POA: Diagnosis not present

## 2020-11-20 DIAGNOSIS — E1165 Type 2 diabetes mellitus with hyperglycemia: Secondary | ICD-10-CM | POA: Diagnosis not present

## 2020-11-20 DIAGNOSIS — K2100 Gastro-esophageal reflux disease with esophagitis, without bleeding: Secondary | ICD-10-CM | POA: Diagnosis not present

## 2020-11-20 DIAGNOSIS — I10 Essential (primary) hypertension: Secondary | ICD-10-CM | POA: Diagnosis not present

## 2021-07-22 DIAGNOSIS — Z13220 Encounter for screening for lipoid disorders: Secondary | ICD-10-CM | POA: Diagnosis not present

## 2021-07-22 DIAGNOSIS — I10 Essential (primary) hypertension: Secondary | ICD-10-CM | POA: Diagnosis not present

## 2021-07-22 DIAGNOSIS — R5383 Other fatigue: Secondary | ICD-10-CM | POA: Diagnosis not present

## 2021-07-22 DIAGNOSIS — K2100 Gastro-esophageal reflux disease with esophagitis, without bleeding: Secondary | ICD-10-CM | POA: Diagnosis not present

## 2021-07-22 DIAGNOSIS — E119 Type 2 diabetes mellitus without complications: Secondary | ICD-10-CM | POA: Diagnosis not present

## 2021-07-24 DIAGNOSIS — I10 Essential (primary) hypertension: Secondary | ICD-10-CM | POA: Diagnosis not present

## 2021-07-24 DIAGNOSIS — K2100 Gastro-esophageal reflux disease with esophagitis, without bleeding: Secondary | ICD-10-CM | POA: Diagnosis not present

## 2021-07-24 DIAGNOSIS — M542 Cervicalgia: Secondary | ICD-10-CM | POA: Diagnosis not present

## 2021-07-24 DIAGNOSIS — K219 Gastro-esophageal reflux disease without esophagitis: Secondary | ICD-10-CM | POA: Diagnosis not present

## 2021-07-24 DIAGNOSIS — E1165 Type 2 diabetes mellitus with hyperglycemia: Secondary | ICD-10-CM | POA: Diagnosis not present

## 2021-10-20 DIAGNOSIS — Z23 Encounter for immunization: Secondary | ICD-10-CM | POA: Diagnosis not present

## 2021-11-20 DIAGNOSIS — I10 Essential (primary) hypertension: Secondary | ICD-10-CM | POA: Diagnosis not present

## 2021-11-20 DIAGNOSIS — K219 Gastro-esophageal reflux disease without esophagitis: Secondary | ICD-10-CM | POA: Diagnosis not present

## 2021-11-20 DIAGNOSIS — R5383 Other fatigue: Secondary | ICD-10-CM | POA: Diagnosis not present

## 2021-11-20 DIAGNOSIS — Z13220 Encounter for screening for lipoid disorders: Secondary | ICD-10-CM | POA: Diagnosis not present

## 2021-11-20 DIAGNOSIS — E119 Type 2 diabetes mellitus without complications: Secondary | ICD-10-CM | POA: Diagnosis not present

## 2021-11-25 DIAGNOSIS — K2100 Gastro-esophageal reflux disease with esophagitis, without bleeding: Secondary | ICD-10-CM | POA: Diagnosis not present

## 2021-11-25 DIAGNOSIS — Z23 Encounter for immunization: Secondary | ICD-10-CM | POA: Diagnosis not present

## 2021-11-25 DIAGNOSIS — E1165 Type 2 diabetes mellitus with hyperglycemia: Secondary | ICD-10-CM | POA: Diagnosis not present

## 2021-11-25 DIAGNOSIS — M542 Cervicalgia: Secondary | ICD-10-CM | POA: Diagnosis not present

## 2021-11-25 DIAGNOSIS — I10 Essential (primary) hypertension: Secondary | ICD-10-CM | POA: Diagnosis not present

## 2022-01-04 DIAGNOSIS — E876 Hypokalemia: Secondary | ICD-10-CM | POA: Diagnosis not present

## 2022-01-04 DIAGNOSIS — N134 Hydroureter: Secondary | ICD-10-CM | POA: Diagnosis not present

## 2022-01-04 DIAGNOSIS — I10 Essential (primary) hypertension: Secondary | ICD-10-CM | POA: Diagnosis not present

## 2022-01-04 DIAGNOSIS — R109 Unspecified abdominal pain: Secondary | ICD-10-CM | POA: Diagnosis not present

## 2022-01-04 DIAGNOSIS — K5732 Diverticulitis of large intestine without perforation or abscess without bleeding: Secondary | ICD-10-CM | POA: Diagnosis not present

## 2022-01-04 DIAGNOSIS — E119 Type 2 diabetes mellitus without complications: Secondary | ICD-10-CM | POA: Diagnosis not present

## 2022-01-04 DIAGNOSIS — Z20822 Contact with and (suspected) exposure to covid-19: Secondary | ICD-10-CM | POA: Diagnosis not present

## 2022-01-04 DIAGNOSIS — Z792 Long term (current) use of antibiotics: Secondary | ICD-10-CM | POA: Diagnosis not present

## 2022-01-04 DIAGNOSIS — Z7984 Long term (current) use of oral hypoglycemic drugs: Secondary | ICD-10-CM | POA: Diagnosis not present

## 2022-01-04 DIAGNOSIS — N1330 Unspecified hydronephrosis: Secondary | ICD-10-CM | POA: Diagnosis not present

## 2022-01-04 DIAGNOSIS — Z794 Long term (current) use of insulin: Secondary | ICD-10-CM | POA: Diagnosis not present

## 2022-01-04 DIAGNOSIS — Z79899 Other long term (current) drug therapy: Secondary | ICD-10-CM | POA: Diagnosis not present

## 2022-01-04 DIAGNOSIS — K449 Diaphragmatic hernia without obstruction or gangrene: Secondary | ICD-10-CM | POA: Diagnosis not present

## 2022-01-04 DIAGNOSIS — K5730 Diverticulosis of large intestine without perforation or abscess without bleeding: Secondary | ICD-10-CM | POA: Diagnosis not present

## 2022-01-04 DIAGNOSIS — K5792 Diverticulitis of intestine, part unspecified, without perforation or abscess without bleeding: Secondary | ICD-10-CM | POA: Diagnosis not present

## 2022-01-04 DIAGNOSIS — Z885 Allergy status to narcotic agent status: Secondary | ICD-10-CM | POA: Diagnosis not present

## 2022-01-13 DIAGNOSIS — R270 Ataxia, unspecified: Secondary | ICD-10-CM | POA: Diagnosis not present

## 2022-01-13 DIAGNOSIS — R29818 Other symptoms and signs involving the nervous system: Secondary | ICD-10-CM | POA: Diagnosis not present

## 2022-01-13 DIAGNOSIS — I951 Orthostatic hypotension: Secondary | ICD-10-CM | POA: Diagnosis not present

## 2022-01-13 DIAGNOSIS — R531 Weakness: Secondary | ICD-10-CM | POA: Diagnosis not present

## 2022-01-13 DIAGNOSIS — E119 Type 2 diabetes mellitus without complications: Secondary | ICD-10-CM | POA: Diagnosis not present

## 2022-01-13 DIAGNOSIS — R42 Dizziness and giddiness: Secondary | ICD-10-CM | POA: Diagnosis not present

## 2022-01-13 DIAGNOSIS — I10 Essential (primary) hypertension: Secondary | ICD-10-CM | POA: Diagnosis not present

## 2022-01-13 DIAGNOSIS — G932 Benign intracranial hypertension: Secondary | ICD-10-CM | POA: Diagnosis not present

## 2022-01-13 DIAGNOSIS — R5381 Other malaise: Secondary | ICD-10-CM | POA: Diagnosis not present

## 2022-01-13 DIAGNOSIS — R930 Abnormal findings on diagnostic imaging of skull and head, not elsewhere classified: Secondary | ICD-10-CM | POA: Diagnosis not present

## 2022-01-13 DIAGNOSIS — R519 Headache, unspecified: Secondary | ICD-10-CM | POA: Diagnosis not present

## 2022-01-13 DIAGNOSIS — E236 Other disorders of pituitary gland: Secondary | ICD-10-CM | POA: Diagnosis not present

## 2022-01-13 DIAGNOSIS — G319 Degenerative disease of nervous system, unspecified: Secondary | ICD-10-CM | POA: Diagnosis not present

## 2022-01-28 DIAGNOSIS — K5732 Diverticulitis of large intestine without perforation or abscess without bleeding: Secondary | ICD-10-CM | POA: Diagnosis not present

## 2022-01-28 DIAGNOSIS — K5792 Diverticulitis of intestine, part unspecified, without perforation or abscess without bleeding: Secondary | ICD-10-CM | POA: Diagnosis not present

## 2022-02-07 DIAGNOSIS — K5792 Diverticulitis of intestine, part unspecified, without perforation or abscess without bleeding: Secondary | ICD-10-CM | POA: Diagnosis not present

## 2022-02-07 DIAGNOSIS — E236 Other disorders of pituitary gland: Secondary | ICD-10-CM | POA: Diagnosis not present

## 2022-02-07 DIAGNOSIS — E860 Dehydration: Secondary | ICD-10-CM | POA: Diagnosis not present

## 2022-02-07 DIAGNOSIS — I951 Orthostatic hypotension: Secondary | ICD-10-CM | POA: Diagnosis not present

## 2022-02-07 DIAGNOSIS — R2689 Other abnormalities of gait and mobility: Secondary | ICD-10-CM | POA: Diagnosis not present

## 2022-02-07 DIAGNOSIS — R197 Diarrhea, unspecified: Secondary | ICD-10-CM | POA: Diagnosis not present

## 2022-02-16 DIAGNOSIS — Z13220 Encounter for screening for lipoid disorders: Secondary | ICD-10-CM | POA: Diagnosis not present

## 2022-02-16 DIAGNOSIS — R5383 Other fatigue: Secondary | ICD-10-CM | POA: Diagnosis not present

## 2022-02-16 DIAGNOSIS — R7309 Other abnormal glucose: Secondary | ICD-10-CM | POA: Diagnosis not present

## 2022-02-16 DIAGNOSIS — K2100 Gastro-esophageal reflux disease with esophagitis, without bleeding: Secondary | ICD-10-CM | POA: Diagnosis not present

## 2022-02-19 DIAGNOSIS — K2100 Gastro-esophageal reflux disease with esophagitis, without bleeding: Secondary | ICD-10-CM | POA: Diagnosis not present

## 2022-02-19 DIAGNOSIS — E1165 Type 2 diabetes mellitus with hyperglycemia: Secondary | ICD-10-CM | POA: Diagnosis not present

## 2022-02-19 DIAGNOSIS — M542 Cervicalgia: Secondary | ICD-10-CM | POA: Diagnosis not present

## 2022-02-19 DIAGNOSIS — K219 Gastro-esophageal reflux disease without esophagitis: Secondary | ICD-10-CM | POA: Diagnosis not present

## 2022-02-19 DIAGNOSIS — I10 Essential (primary) hypertension: Secondary | ICD-10-CM | POA: Diagnosis not present

## 2022-02-25 DIAGNOSIS — K5732 Diverticulitis of large intestine without perforation or abscess without bleeding: Secondary | ICD-10-CM | POA: Diagnosis not present

## 2022-03-04 DIAGNOSIS — K429 Umbilical hernia without obstruction or gangrene: Secondary | ICD-10-CM | POA: Diagnosis not present

## 2022-03-04 DIAGNOSIS — K5732 Diverticulitis of large intestine without perforation or abscess without bleeding: Secondary | ICD-10-CM | POA: Diagnosis not present

## 2022-03-04 DIAGNOSIS — K3189 Other diseases of stomach and duodenum: Secondary | ICD-10-CM | POA: Diagnosis not present

## 2022-03-04 DIAGNOSIS — K6389 Other specified diseases of intestine: Secondary | ICD-10-CM | POA: Diagnosis not present

## 2022-03-11 DIAGNOSIS — K5732 Diverticulitis of large intestine without perforation or abscess without bleeding: Secondary | ICD-10-CM | POA: Diagnosis not present

## 2022-03-23 ENCOUNTER — Ambulatory Visit: Payer: BLUE CROSS/BLUE SHIELD | Admitting: Neurology

## 2022-03-23 ENCOUNTER — Encounter: Payer: Self-pay | Admitting: Neurology

## 2022-03-23 ENCOUNTER — Ambulatory Visit: Payer: Medicare Other | Admitting: Neurology

## 2022-03-23 VITALS — BP 146/94 | HR 91 | Ht 69.0 in | Wt 245.2 lb

## 2022-03-23 DIAGNOSIS — G08 Intracranial and intraspinal phlebitis and thrombophlebitis: Secondary | ICD-10-CM

## 2022-03-23 DIAGNOSIS — R519 Headache, unspecified: Secondary | ICD-10-CM

## 2022-03-23 DIAGNOSIS — G932 Benign intracranial hypertension: Secondary | ICD-10-CM | POA: Diagnosis not present

## 2022-03-23 DIAGNOSIS — E236 Other disorders of pituitary gland: Secondary | ICD-10-CM

## 2022-03-23 DIAGNOSIS — H93A2 Pulsatile tinnitus, left ear: Secondary | ICD-10-CM

## 2022-03-23 DIAGNOSIS — H538 Other visual disturbances: Secondary | ICD-10-CM | POA: Diagnosis not present

## 2022-03-23 NOTE — Patient Instructions (Addendum)
CT of the blood vessels of the head - will call ?Need eye exam - ophthalmology for papilledema ?Possibly a lumbar puncture/spinal tap if above negative for IDIOPATHIC INTRACRANIAL HYPERTENSION  ? ?Idiopathic Intracranial Hypertension ? ?Idiopathic intracranial hypertension (IIH) is a condition that increases pressure around the brain. The fluid that surrounds the brain and spinal cord (cerebrospinal fluid, or CSF) increases and causes the pressure. Idiopathic means that the cause of this condition is not known. ?IIH affects the brain and spinal cord (neurological disorder). If this condition is not treated, it can cause vision loss or blindness. ?What are the causes? ?The cause of this condition is not known. ?What increases the risk? ?The following factors may make you more likely to develop this condition: ?Being very overweight (obese). ?Being a female between the ages of 57 and 11 years old, who has not gone through menopause. ?Taking certain medicines, such as birth control or steroids. ?What are the signs or symptoms? ?Symptoms of this condition include: ?Headaches. This is the most common symptom. ?Brief episodes of total blindness. ?Double vision, blurred vision, or poor side (peripheral) vision. ?Pain in the shoulders or neck. ?Nausea and vomiting. ?A sound like rushing water or a pulsing sound within the ears (pulsatile tinnitus), or ringing in the ears. ?How is this diagnosed? ?This condition may be diagnosed based on: ?Your symptoms and medical history. ?Imaging tests of the brain, such as: ?CT scan. ?MRI. ?Magnetic resonance venogram (MRV) to check the veins. ?Diagnostic lumbar puncture. This is a procedure to remove and examine a sample of cerebrospinal fluid. This procedure can determine whether too much fluid may be causing IIH. ?A thorough eye exam to check for swelling or nerve damage in the eyes. ?How is this treated? ?Treatment for this condition depends on the symptoms. The goal of treatment is  to decrease the pressure around your brain. Common treatments include: ?Weight loss through healthy eating, salt restriction, and exercise, if you are overweight. ?Medicines to decrease the production of spinal fluid and lower the pressure within your skull. ?Medicines to prevent or treat headaches. ?Other treatments may include: ?Surgery to place drains (shunts) in your brain for removing excess fluid. ?Lumbar puncture to remove excess cerebrospinal fluid. ?Follow these instructions at home: ?If you are overweight or obese, work with your health care provider to lose weight. ?Take over-the-counter and prescription medicines only as told by your health care provider. ?Ask your health care provider if the medicine prescribed to you requires you to avoid driving or using machinery. ?Do not use any products that contain nicotine or tobacco, such as cigarettes, e-cigarettes, and chewing tobacco. If you need help quitting, ask your health care provider. ?Keep all follow-up visits as told by your health care provider. This is important. ?Contact a health care provider if: ?You have changes in your vision, such as: ?Double vision. ?Blurred vision. ?Poor peripheral vision. ?Get help right away if: ?You have any of the following symptoms and they get worse or do not get better: ?Headaches. ?Nausea. ?Vomiting. ?Sudden trouble seeing. ?Summary ?Idiopathic intracranial hypertension (IIH) is a condition that increases pressure around the brain. The cause is not known (is idiopathic). ?The most common symptom of IIH is headaches. Vision changes, pain in the shoulders or neck, nausea, and vomiting may also occur. ?Treatment for this condition depends on your symptoms. The goal of treatment is to decrease the pressure around your brain. ?If you are overweight or obese, work with your health care provider to lose weight. ?Take  over-the-counter and prescription medicines only as told by your health care provider. ?This information is  not intended to replace advice given to you by your health care provider. Make sure you discuss any questions you have with your health care provider. ?Document Revised: 10/28/2019 Document Reviewed: 10/28/2019 ?Elsevier Patient Education ? [place]. ? ?Lumbar Puncture ?A lumbar puncture, also called a spinal tap, is a procedure that removes a small amount of the fluid that surrounds the brain and spinal cord (cerebrospinal fluid, CSF). The fluid is then examined in the lab. This procedure may be done to: ?Help diagnose various problems, such as meningitis, encephalitis, multiple sclerosis, and other infections. ?Remove fluid and relieve pressure that occurs with certain types of headaches. ?Look for bleeding within the brain and spinal cord areas (central nervous system). ?Place medicine into the spinal fluid. ?Tell a health care provider about: ?Any allergies you have. ?All medicines you are taking, including vitamins, herbs, eye drops, creams, and over-the-counter medicines like aspirin or NSAIDs. ?Any problems you or family members have had with anesthetic medicines. ?Any bleeding problems you have. ?Any surgeries you have had. ?Any medical conditions you have. ?Whether you are pregnant or may be pregnant. ?What are the risks? ?Generally, this is a safe procedure. However, problems may occur, including: ?Infection at the insertion site that can spread to the bone or CSF. ?Bleeding. ?Leakage of CSF. ?Spinal headache. This is a severe headache that occurs when there is a leak of CSF. ?Allergic reactions to medicines or dyes. ?Damage to other structures or organs. ?What happens before the procedure? ?Medicines ?Ask your health care provider about: ?Changing or stopping your regular medicines. This is especially important if you are taking diabetes medicines or blood thinners. ?Taking medicines such as aspirin and ibuprofen. These medicines can thin your blood. Do not take these medicines unless your health  care provider tells you to take them ?Taking over-the-counter medicines, vitamins, herbs, and supplements ?General instructions ?You may have a blood sample taken. ?Ask your health care provider: ?How your puncture site will be marked. ?What steps will be taken to help prevent infection. These may include: ?Removing hair at the puncture site. ?Washing skin with a germ-killing soap. ?Receiving antibiotic medicine. ?If you will be going home right after the procedure, plan to have a responsible adult: ?When to stop eating and drinking ?Follow instructions from your health care provider about what you may eat and drink before your procedure. These may include: ?8 hours before the procedure ?Stop eating most foods. Do not eat meat, fried foods, or fatty foods. ?Eat only light foods, such as toast or crackers. ?All liquids are okay except energy drinks and alcohol. ?6 hours before the procedure ?Stop eating. ?Drink only clear liquids, such as water, clear fruit juice, black coffee, plain tea, and sports drinks. ?Do not drink energy drinks or alcohol. ?2 hours before the procedure ?Stop drinking all liquids. ?You may be allowed to take medicine with small sips of water. ?If you do not follow your health care provider's instructions, your procedure may be delayed or canceled. ?What happens during the procedure? ? ?You may lie down on your side with your knees bent, or you may sit with your head resting on a pillow on your lap. ?How you are positioned depends on your age and size. ?You will be positioned so that the spaces between the bones of the spine (vertebrae) are as wide as possible. This will make it easier to pass the needle into  the spinal canal. ?The skin on your lower back (lumbar region) will be cleaned. ?You will be given an injection of medicine to numb your lower back area (local anesthetic). ?You may be given pain medicine or a medicine to help you relax (sedative). ?A small needle will be inserted into your  lower back until it enters the space that contains the CSF. The needle will not enter the spinal cord. ?CSF will be collected into tubes. It will be sent to a lab for examination. ?The needle will

## 2022-03-23 NOTE — Progress Notes (Signed)
?[facility] ? ? ? ?Provider:  Dr Lucia Gaskins ?Requesting Provider: Estanislado Pandy, MD ?Primary Care Provider:  Estanislado Pandy, MD ? ?CC:  headaches ? ?HPI:  Donna Casey is a 61 y.o. female here as requested by Estanislado Pandy, MD for imbalance.  She has a past medical history of prediabetes, diverticulosis, recent admission for diverticulitis, back surgery 1991, hip surgery 2009, knee replacement 2019, neck surgery 2018, rotator cuff surgery 2018, and tubal ligation in the late 90s.  Patient was recently admitted for diverticulitis at which time she developed imbalance, dizziness and MRI of the brain showed partially empty sella.  She was hospitalized for diverticulitis, imbalance started with the diverticulitis. She felt she was swaying, she couldn't stand had to be in a wheelchair. When she stood up she was imbalanced, dizziness, lightheadedness like faint, clammy and breaking out in a sweat, then she would get cold and start sweating, after admission she did th ect head. Now she feels she has a headache, the imbalance is better, but she has a constant dull headache. She thinks she saw a vein in her neck on the left and when she pushed on it she had a sensation in her head like something sticking her and she would rub it but something sticking in the top of her head. She has had 3 level acdf and that gets sore. She feels she has vision changes, she blinks or closes her eyes and that goes away. Like a band or a squeeze around her head. Vein was on the left side. She hears pulsing in the left ear. And feel her heart beat. Brother had fluid in brain, kept getting worse, he had to have surgery. No other focal neurologic deficits, associated symptoms, inciting events or modifiable factors. ? ? ?Reviewed notes, labs and imaging from outside physicians, which showed: ?MRI brain 01-14-2022: ?1. No acute intracranial abnormality.  ?2. Mild cerebral and cerebellar atrophy.  ?3. Partially empty sella,  often an incidental finding though can be  ?seen with idiopathic intracranial hypertension.  ? ? ? ?02/25/22: BUN 8 Creat .93 ? ?Review of Systems: ?Patient complains of symptoms per HPI as well as the following symptoms headache. Pertinent negatives and positives per HPI. All others negative. ? ? ?Social History  ? ?Socioeconomic History  ? Marital status: Married  ?  Spouse name: Not on file  ? Number of children: Not on file  ? Years of education: Not on file  ? Highest education level: Not on file  ?Occupational History  ? Not on file  ?Tobacco Use  ? Smoking status: Never  ? Smokeless tobacco: Never  ?Vaping Use  ? Vaping Use: Never used  ?Substance and Sexual Activity  ? Alcohol use: No  ? Drug use: No  ? Sexual activity: Not on file  ?Other Topics Concern  ? Not on file  ?Social History Narrative  ? Not on file  ? ?Social Determinants of Health  ? ?Financial Resource Strain: Not on file  ?Food Insecurity: Not on file  ?Transportation Needs: Not on file  ?Physical Activity: Not on file  ?Stress: Not on file  ?Social Connections: Not on file  ?Intimate Partner Violence: Not on file  ? ? ?Family History  ?Problem Relation Age of Onset  ? Neuropathy Neg Hx   ? ? ?Past Medical History:  ?Diagnosis Date  ? Anemia   ? Arthritis   ? Diabetes mellitus without complication (HCC)   ? Enlarged liver   ?  Family history of adverse reaction to anesthesia   ? N/V  ? Fatty liver   ? GERD (gastroesophageal reflux disease)   ? H pylori ulcer   ? Heart palpitations   ? Hypertension   ? Numbness and tingling in hands   ? right hand  ? PONV (postoperative nausea and vomiting)   ? Primary localized osteoarthritis of left knee 08/02/2018  ? ? ?Patient Active Problem List  ? Diagnosis Date Noted  ? Primary localized osteoarthritis of left knee 08/02/2018  ? Lumbar radiculopathy 03/14/2017  ? ? ?Past Surgical History:  ?Procedure Laterality Date  ? BACK SURGERY    ? disk removal  ? CERVICAL FUSION    ? COLONOSCOPY    ? DILITATION &  CURRETTAGE/HYSTROSCOPY WITH NOVASURE ABLATION N/A 07/14/2016  ? Procedure: DILATATION & CURETTAGE/HYSTEROSCOPY WITH NOVASURE ABLATION;  Surgeon: Levi Aland, MD;  Location: [facility];  Service: Gynecology;  Laterality: N/A;  ? JOINT REPLACEMENT    ? MOUTH SURGERY    ? ROTATOR CUFF REPAIR  05/2017  ? TOTAL HIP ARTHROPLASTY Right   ? TOTAL KNEE ARTHROPLASTY Left 08/02/2018  ? Procedure: TOTAL KNEE ARTHROPLASTY;  Surgeon: Teryl Lucy, MD;  Location: [facility];  Service: Orthopedics;  Laterality: Left;  ? TUBAL LIGATION    ? ? ?Current Outpatient Medications  ?Medication Sig Dispense Refill  ? amLODipine (NORVASC) 5 MG tablet Take 5 mg by mouth daily.  12  ? ciprofloxacin (CIPRO) 500 MG tablet SMARTSIG:1 Tablet(s) By Mouth Every 12 Hours    ? losartan (COZAAR) 25 MG tablet Take 25 mg by mouth daily.    ? metFORMIN (GLUCOPHAGE-XR) 500 MG 24 hr tablet Take 500 mg by mouth daily.    ? methocarbamol (ROBAXIN) 750 MG tablet Take 1 tablet (750 mg total) by mouth every 8 (eight) hours as needed for muscle spasms. 30 tablet 2  ? metroNIDAZOLE (FLAGYL) 500 MG tablet Take 500 mg by mouth 3 (three) times daily.    ? omeprazole (PRILOSEC) 20 MG capsule Take 20 mg by mouth daily.  4  ? rosuvastatin (CRESTOR) 5 MG tablet Take 5 mg by mouth every other day.    ? ?No current facility-administered medications for this visit.  ? ? ?Allergies as of 03/23/2022 - Review Complete 03/23/2022  ?Allergen Reaction Noted  ? Oxycodone Nausea And Vomiting 06/30/2016  ? ? ?Vitals: ?BP (!) 146/94   Pulse 91   Ht 5\' 9"  (1.753 m)   Wt 245 lb 3.2 oz (111.2 kg)   BMI 36.21 kg/m?  ?Last Weight:  ?Wt Readings from Last 1 Encounters:  ?03/23/22 245 lb 3.2 oz (111.2 kg)  ? ?Last Height:   ?Ht Readings from Last 1 Encounters:  ?03/23/22 5\' 9"  (1.753 m)  ? ? ? ?Physical exam: ?Exam: ?Gen: NAD, conversant, well nourised, obese, well groomed                     ?CV: RRR, no MRG. No Carotid Bruits. No peripheral edema, warm, nontender ?Eyes: Conjunctivae clear  without exudates or hemorrhage ? ?Neuro: ?Detailed Neurologic Exam ? ?Speech: ?   Speech is normal; fluent and spontaneous with normal comprehension.  ?Cognition: ?   The patient is oriented to person, place, and time;  ?   recent and remote memory intact;  ?   language fluent;  ?   normal attention, concentration,  ?   fund of knowledge ?Cranial Nerves: ?   The pupils are equal, round, and reactive to  light. The fundi are normal and spontaneous venous pulsations are present. Visual fields are full to finger confrontation. Extraocular movements are intact. Trigeminal sensation is intact and the muscles of mastication are normal. The face is symmetric. The palate elevates in the midline. Hearing intact. Voice is normal. Shoulder shrug is normal. The tongue has normal motion without fasciculations.  ? ?Coordination: ?   Normal finger to nose and heel to shin. Normal rapid alternating movements.  ? ?Gait: ?   Heel-toe and tandem gait are normal.  ? ?Motor Observation: ?   No asymmetry, no atrophy, and no involuntary movements noted. ?Tone: ?   Normal muscle tone.   ? ?Posture: ?   Posture is normal. normal erect ?   ?Strength: ?   Strength is V/V in the upper and lower limbs.  ?    ?Sensation: intact to LT ?    ?Reflex Exam: ? ?DTR's: ?   Deep tendon reflexes in the upper and lower extremities are normal bilaterally.   ?Toes: ?   The toes are downgoing bilaterally.   ?Clonus: ?   Clonus is absent. ?  ? ?Assessment/Plan:  61 y.o. female here as requested by Estanislado PandySasser, Paul W, MD for imbalance.  She has a past medical history of prediabetes, diverticulosis, recent admission for diverticulitis, back surgery 1991, hip surgery 2009, knee replacement 2019, neck surgery 2018, rotator cuff surgery 2018, and tubal ligation in the late 90s.  Patient was recently admitted for diverticulitis at which time she developed imbalance, dizziness and MRI of the brain showed partially empty sella.  She is out of the age range for idiopathic  intracranial hypertension, however she does report multiple symptoms consistent with high pressure headache including vision changes, pressure around the head, pulsating in her ear, She feels she has episodic blurr

## 2022-03-24 ENCOUNTER — Encounter: Payer: Self-pay | Admitting: Neurology

## 2022-03-25 ENCOUNTER — Telehealth: Payer: Self-pay | Admitting: Neurology

## 2022-03-25 NOTE — Telephone Encounter (Signed)
Referral for Opthalmology sent to [facility] [phone]. ?

## 2022-03-25 NOTE — Telephone Encounter (Signed)
[employer] medicare order sent to [facility], [facility] they will reach out to the patient to schedule.  

## 2022-03-30 NOTE — Telephone Encounter (Signed)
I sent a message to Rinaldo Cloud and Pieter Partridge and [facility] asking them to get the patient scheduled sooner.  ?

## 2022-04-06 ENCOUNTER — Ambulatory Visit: Payer: BLUE CROSS/BLUE SHIELD | Admitting: Neurology

## 2022-04-15 DIAGNOSIS — H25813 Combined forms of age-related cataract, bilateral: Secondary | ICD-10-CM | POA: Diagnosis not present

## 2022-04-15 DIAGNOSIS — H532 Diplopia: Secondary | ICD-10-CM | POA: Diagnosis not present

## 2022-04-15 DIAGNOSIS — H5052 Exophoria: Secondary | ICD-10-CM | POA: Diagnosis not present

## 2022-04-15 DIAGNOSIS — G932 Benign intracranial hypertension: Secondary | ICD-10-CM | POA: Diagnosis not present

## 2022-04-22 ENCOUNTER — Ambulatory Visit
Admission: RE | Admit: 2022-04-22 | Discharge: 2022-04-22 | Disposition: A | Discharge status: Home / Self Care | Payer: Medicare Other | Source: Ambulatory Visit | Attending: Neurology | Admitting: Neurology

## 2022-04-22 ENCOUNTER — Other Ambulatory Visit: Payer: Self-pay

## 2022-04-22 DIAGNOSIS — H93A2 Pulsatile tinnitus, left ear: Secondary | ICD-10-CM | POA: Diagnosis not present

## 2022-04-22 DIAGNOSIS — G932 Benign intracranial hypertension: Secondary | ICD-10-CM | POA: Diagnosis not present

## 2022-04-22 DIAGNOSIS — H538 Other visual disturbances: Secondary | ICD-10-CM | POA: Diagnosis not present

## 2022-04-22 DIAGNOSIS — R202 Paresthesia of skin: Secondary | ICD-10-CM | POA: Diagnosis not present

## 2022-04-22 MED ORDER — IOPAMIDOL (ISOVUE-370) INJECTION 76%
75.0000 mL | Freq: Once | INTRAVENOUS | Status: AC | PRN
  Administered 2022-04-22: 75 mL via INTRAVENOUS

## 2022-04-24 ENCOUNTER — Encounter: Payer: Self-pay | Admitting: Neurology

## 2022-04-24 NOTE — Progress Notes (Signed)
Please call patient and if she is willing to get the LP please let me know so I can order: CTA H&N shows that the blood vessels are normal, nothing seen to explain symptoms. I think we should proceed to lumbar puncture/spinal tap as discussed to look for IDIOPATHIC INTRACRANIAL HYPERTENSION.  Thanks, Dr. Lucia Gaskins

## 2022-04-28 ENCOUNTER — Telehealth: Payer: Self-pay | Admitting: *Deleted

## 2022-04-28 NOTE — Telephone Encounter (Signed)
Spoke to patient gave test results . Pt is agreeable to move forward with Lumbar puncture/spinal tap.Will make Dr Lucia Gaskins aware so can place order for test . Pt thanked me for calling her with results and Dr Celene Squibb recommendation

## 2022-04-28 NOTE — Telephone Encounter (Signed)
-----   Message from Anson Fret, MD sent at 04/24/2022  8:17 AM EDT ----- Please call patient and if she is willing to get the LP please let me know so I can order: CTA H&N shows that the blood vessels are normal, nothing seen to explain symptoms. I think we should proceed to lumbar puncture/spinal tap as discussed to look for IDIOPATHIC INTRACRANIAL HYPERTENSION.  Thanks, Dr. Lucia Gaskins

## 2022-04-29 ENCOUNTER — Other Ambulatory Visit: Payer: Self-pay | Admitting: Neurology

## 2022-04-29 DIAGNOSIS — G932 Benign intracranial hypertension: Secondary | ICD-10-CM

## 2022-06-18 DIAGNOSIS — Z13220 Encounter for screening for lipoid disorders: Secondary | ICD-10-CM | POA: Diagnosis not present

## 2022-06-18 DIAGNOSIS — R5383 Other fatigue: Secondary | ICD-10-CM | POA: Diagnosis not present

## 2022-06-18 DIAGNOSIS — I10 Essential (primary) hypertension: Secondary | ICD-10-CM | POA: Diagnosis not present

## 2022-06-18 DIAGNOSIS — K219 Gastro-esophageal reflux disease without esophagitis: Secondary | ICD-10-CM | POA: Diagnosis not present

## 2022-06-18 DIAGNOSIS — E1165 Type 2 diabetes mellitus with hyperglycemia: Secondary | ICD-10-CM | POA: Diagnosis not present

## 2022-06-18 DIAGNOSIS — E119 Type 2 diabetes mellitus without complications: Secondary | ICD-10-CM | POA: Diagnosis not present

## 2022-06-23 ENCOUNTER — Ambulatory Visit: Payer: Medicare Other | Admitting: Neurology

## 2022-06-23 ENCOUNTER — Encounter: Payer: Self-pay | Admitting: Neurology

## 2022-06-23 VITALS — BP 138/88 | HR 85 | Ht 69.0 in | Wt 246.4 lb

## 2022-06-23 DIAGNOSIS — R519 Headache, unspecified: Secondary | ICD-10-CM

## 2022-06-23 NOTE — Progress Notes (Signed)
[facility]    Provider:  Dr Lucia Gaskins Requesting Provider: Estanislado Pandy, MD Primary Care Provider:  Estanislado Pandy, MD  CC:  headaches  Follow-up June 23, 2022: We saw patient in April 2023 for imbalance after diverticulosis in the hospital which improved but patient was then complaining of headache which is the condition we evaluated her for.  The MRI of brain did show partially empty sella,  work-up was ordered  including an eye exam with Dr. Dione Booze, CTA of the head and neck, lumbar puncture which was ordered but never completed, CTV was also ordered and never completed. Plan was to follow up after completion, doesn't appear patient was adherent.   She saw Dr. Dione Booze, no papilledema or eye issues, headaches are improved, headaches are not often, feel improved so didn;t get some of the testing. Pulsatile tinnitus is resolved so she did not get the CTA. Headaches resolved, eye exam was normal so didn't get the CTV or spinal tap. Balance is better. If she is feeling better we can monitor clinically.  She can return to clinic as needed.   Patient complains of symptoms per HPI as well as the following symptoms: none . Pertinent negatives and positives per HPI. All others negative   HPI 03/03/2022:  Donna Casey is a 61 y.o. female here as requested by Estanislado Pandy, MD for imbalance.  She has a past medical history of prediabetes, diverticulosis, recent admission for diverticulitis, back surgery 1991, hip surgery 2009, knee replacement 2019, neck surgery 2018, rotator cuff surgery 2018, and tubal ligation in the late 90s.  Patient was recently admitted for diverticulitis at which time she developed imbalance, dizziness and MRI of the brain showed partially empty sella.  She was hospitalized for diverticulitis, imbalance started with the diverticulitis. She felt she was swaying, she couldn't stand had to be in a wheelchair. When she stood up she was imbalanced, dizziness,  lightheadedness like faint, clammy and breaking out in a sweat, then she would get cold and start sweating, after admission she did th ect head. Now she feels she has a headache, the imbalance is better, but she has a constant dull headache. She thinks she saw a vein in her neck on the left and when she pushed on it she had a sensation in her head like something sticking her and she would rub it but something sticking in the top of her head. She has had 3 level acdf and that gets sore. She feels she has vision changes, she blinks or closes her eyes and that goes away. Like a band or a squeeze around her head. Vein was on the left side. She hears pulsing in the left ear. And feel her heart beat. Brother had fluid in brain, kept getting worse, he had to have surgery. No other focal neurologic deficits, associated symptoms, inciting events or modifiable factors.   Reviewed notes, labs and imaging from outside physicians, which showed: MRI brain 01-14-2022: 1. No acute intracranial abnormality.  2. Mild cerebral and cerebellar atrophy.  3. Partially empty sella, often an incidental finding though can be  seen with idiopathic intracranial hypertension.     02/25/22: BUN 8 Creat .93  Review of Systems: Patient complains of symptoms per HPI as well as the following symptoms headache. Pertinent negatives and positives per HPI. All others negative.   Social History   Socioeconomic History   Marital status: Married    Spouse name: Not on file   Number  of children: Not on file   Years of education: Not on file   Highest education level: Not on file  Occupational History   Not on file  Tobacco Use   Smoking status: Never   Smokeless tobacco: Never  Vaping Use   Vaping Use: Never used  Substance and Sexual Activity   Alcohol use: No   Drug use: No   Sexual activity: Not on file  Other Topics Concern   Not on file  Social History Narrative   Not on file   Social Determinants of Health    Financial Resource Strain: Not on file  Food Insecurity: Not on file  Transportation Needs: Not on file  Physical Activity: Not on file  Stress: Not on file  Social Connections: Not on file  Intimate Partner Violence: Not on file    Family History  Problem Relation Age of Onset   Neuropathy Neg Hx     Past Medical History:  Diagnosis Date   Anemia    Arthritis    Diabetes mellitus without complication (HCC)    Enlarged liver    Family history of adverse reaction to anesthesia    N/V   Fatty liver    GERD (gastroesophageal reflux disease)    H pylori ulcer    Heart palpitations    Hypertension    Numbness and tingling in hands    right hand   PONV (postoperative nausea and vomiting)    Primary localized osteoarthritis of left knee 08/02/2018    Patient Active Problem List   Diagnosis Date Noted   Primary localized osteoarthritis of left knee 08/02/2018   Lumbar radiculopathy 03/14/2017    Past Surgical History:  Procedure Laterality Date   BACK SURGERY     disk removal   CERVICAL FUSION     COLONOSCOPY     DILITATION & CURRETTAGE/HYSTROSCOPY WITH NOVASURE ABLATION N/A 07/14/2016   Procedure: DILATATION & CURETTAGE/HYSTEROSCOPY WITH NOVASURE ABLATION;  Surgeon: Levi Aland, MD;  Location: [facility];  Service: Gynecology;  Laterality: N/A;   JOINT REPLACEMENT     MOUTH SURGERY     ROTATOR CUFF REPAIR  05/2017   TOTAL HIP ARTHROPLASTY Right    TOTAL KNEE ARTHROPLASTY Left 08/02/2018   Procedure: TOTAL KNEE ARTHROPLASTY;  Surgeon: Teryl Lucy, MD;  Location: [facility];  Service: Orthopedics;  Laterality: Left;   TUBAL LIGATION      Current Outpatient Medications  Medication Sig Dispense Refill   amLODipine (NORVASC) 5 MG tablet Take 5 mg by mouth daily.  12   ciprofloxacin (CIPRO) 500 MG tablet SMARTSIG:1 Tablet(s) By Mouth Every 12 Hours     losartan (COZAAR) 25 MG tablet Take 25 mg by mouth daily.     metFORMIN (GLUCOPHAGE-XR) 500 MG 24 hr tablet Take 500 mg  by mouth daily.     methocarbamol (ROBAXIN) 750 MG tablet Take 1 tablet (750 mg total) by mouth every 8 (eight) hours as needed for muscle spasms. 30 tablet 2   metroNIDAZOLE (FLAGYL) 500 MG tablet Take 500 mg by mouth 3 (three) times daily.     omeprazole (PRILOSEC) 20 MG capsule Take 20 mg by mouth daily.  4   rosuvastatin (CRESTOR) 5 MG tablet Take 5 mg by mouth every other day.     No current facility-administered medications for this visit.    Allergies as of 06/23/2022 - Review Complete 06/23/2022  Allergen Reaction Noted   Oxycodone Nausea And Vomiting 06/30/2016    Vitals: BP 138/88  Pulse 85   Ht 5\' 9"  (1.753 m)   Wt 246 lb 6.4 oz (111.8 kg)   BMI 36.39 kg/m  Last Weight:  Wt Readings from Last 1 Encounters:  06/23/22 246 lb 6.4 oz (111.8 kg)   Last Height:   Ht Readings from Last 1 Encounters:  06/23/22 5\' 9"  (1.753 m)   Exam: NAD, pleasant                  Speech:    Speech is normal; fluent and spontaneous with normal comprehension.  Cognition:    The patient is oriented to person, place, and time;     recent and remote memory intact;     language fluent;    Cranial Nerves:    The pupils are equal, round, and reactive to light.Trigeminal sensation is intact and the muscles of mastication are normal. The face is symmetric. The palate elevates in the midline. Hearing intact. Voice is normal. Shoulder shrug is normal. The tongue has normal motion without fasciculations.   Coordination:  No dysmetria  Motor Observation:    No asymmetry, no atrophy, and no involuntary movements noted. Tone:    Normal muscle tone.     Strength:    Strength is V/V in the upper and lower limbs.      Sensation: intact to LT       Assessment/Plan:  61 y.o. female here as requested by , MD initially for for imbalance.  She has a past medical history of prediabetes, diverticulosis, recent admission for diverticulitis, back surgery 1991, hip surgery 2009, knee  replacement 2019, neck surgery 2018, rotator cuff surgery 2018, and tubal ligation in the late 90s.  Patient was admitted for diverticulitis at which time she developed imbalance, dizziness and MRI of the brain showed partially empty sella and she reported headaches and some symptoms of IDIOPATHIC INTRACRANIAL HYPERTENSION so At that time we ordered a workup which was not completed. She also  had an unusual family history of brother and 2 daughters with pseudotumor cerebri.we ordered imaging, LP and ophtho exam.   -work-up was ordered  including an eye exam with Dr. 2019, CTA of the head and neck, lumbar puncture which was ordered but never completed, CTV was also ordered and never completed. Plan was to follow up after completion. But she saw Dr. 2019, no papilledema or eye issues, headaches resolved so she declined further testing testing. Pulsatile tinnitus resolved so she did not get the CTA. Headaches resolved, eye exam was normal so didn't get the CTV o spinal tap. Balance is better. If she is feeling better we can monitor clinically.   Cc: Sasser, Dione Booze, MD,  Sasser, Dione Booze, MD  Clarene Critchley, MD  [facility] [address] [place], [address] [place]  Phone [phone] Fax [phone]  I spent 30 minutes of face-to-face and non-face-to-face time with patient on the  1. Nonintractable headache, unspecified chronicity pattern, unspecified headache type    diagnosis.  This included previsit chart review, lab review, study review, order entry, electronic health record documentation, patient education on the different diagnostic and therapeutic options, counseling and coordination of care, risks and benefits of management, compliance, or risk factor reduction

## 2022-06-23 NOTE — Patient Instructions (Signed)
Follow up as needed

## 2022-06-29 DIAGNOSIS — Z23 Encounter for immunization: Secondary | ICD-10-CM | POA: Diagnosis not present

## 2022-06-29 DIAGNOSIS — M542 Cervicalgia: Secondary | ICD-10-CM | POA: Diagnosis not present

## 2022-06-29 DIAGNOSIS — I10 Essential (primary) hypertension: Secondary | ICD-10-CM | POA: Diagnosis not present

## 2022-06-29 DIAGNOSIS — K5790 Diverticulosis of intestine, part unspecified, without perforation or abscess without bleeding: Secondary | ICD-10-CM | POA: Diagnosis not present

## 2022-06-29 DIAGNOSIS — K2100 Gastro-esophageal reflux disease with esophagitis, without bleeding: Secondary | ICD-10-CM | POA: Diagnosis not present

## 2022-06-29 DIAGNOSIS — K219 Gastro-esophageal reflux disease without esophagitis: Secondary | ICD-10-CM | POA: Diagnosis not present

## 2022-06-29 DIAGNOSIS — Z1389 Encounter for screening for other disorder: Secondary | ICD-10-CM | POA: Diagnosis not present

## 2022-06-29 DIAGNOSIS — E1165 Type 2 diabetes mellitus with hyperglycemia: Secondary | ICD-10-CM | POA: Diagnosis not present

## 2022-07-15 ENCOUNTER — Encounter: Payer: Self-pay | Admitting: *Deleted

## 2022-07-15 ENCOUNTER — Other Ambulatory Visit: Payer: Self-pay | Admitting: *Deleted

## 2022-07-15 DIAGNOSIS — E1169 Type 2 diabetes mellitus with other specified complication: Secondary | ICD-10-CM

## 2022-07-15 NOTE — Patient Outreach (Signed)
  Care Coordination   Initial Visit Note   07/15/2022 Name: JOLINDA PINKSTAFF MRN: [id number] DOB: August 03, 1961  CURRY DULSKI is a 61 y.o. year old female who sees Sasser, Clarene Critchley, MD for primary care. I spoke with  Alverda Skeans by phone today  What matters to the patients health and wellness today?  History of hip replacement.  Feels she need to become more active.   Confirmed there is no cost for PREP program for member as she is an [facility] member, agrees to referral.    State A1C is now better controlled, taking Oxempic.  Denies the need for medication assistance.  Think she had AWV in April, will collaborate with PCP office to confirm.   Goals Addressed               This Visit's Progress     Stay active (increase exercise) and decrease risk of complications of diverticulitis (pt-stated)        Care Coordination Interventions: Advised patient to consider joining gym (discussed PREP program at [facility], will consider if there is no cost) Provided education to patient re: foods that are good for diverticulitis Reviewed scheduled/upcoming provider appointments including need for AWV Discussed plans with patient for ongoing care management follow up and provided patient with direct contact information for care management team Assessed social determinant of health barriers         SDOH assessments and interventions completed:  Yes  SDOH Interventions Today    Flowsheet Row Most Recent Value  SDOH Interventions   Food Insecurity Interventions Intervention Not Indicated  Housing Interventions Intervention Not Indicated  Transportation Interventions Intervention Not Indicated        Care Coordination Interventions Activated:  Yes  Care Coordination Interventions:  Yes, provided   Follow up plan: Follow up call scheduled for 9/26    Encounter Outcome:  Pt. Visit Completed

## 2022-07-15 NOTE — Patient Instructions (Signed)
Visit Information  Thank you for taking time to visit with me today. Please don't hesitate to contact me if I can be of assistance to you before our next scheduled telephone appointment.  Following are the goals we discussed today:  Referral was placed to PREP program with [employer] through [facility].  They will call you with more information  Our next appointment is by telephone on 9/26   Patient verbalizes understanding of instructions and care plan provided today and agrees to view in MyChart. Active MyChart status and patient understanding of how to access instructions and care plan via MyChart confirmed with patient.     The patient has been provided with contact information for the care management team and has been advised to call with any health related questions or concerns.   Kemper Durie, RN, MSN, [facility] Care Coordinator [phone]

## 2022-07-17 ENCOUNTER — Telehealth: Payer: Self-pay | Admitting: *Deleted

## 2022-07-17 NOTE — Telephone Encounter (Signed)
Called regarding PREP Class referral. Patient was at work and requested a call back on 07/21/22. Will attempt to reach her next week.

## 2022-07-23 ENCOUNTER — Telehealth: Payer: Self-pay | Admitting: *Deleted

## 2022-07-23 NOTE — Telephone Encounter (Signed)
Left voice mail regarding PREP Class enrollment.

## 2022-07-23 NOTE — Telephone Encounter (Signed)
Patient returned call regarding PREP program. Would like a call back for next [place] class late Nov/Dec.

## 2022-08-25 ENCOUNTER — Ambulatory Visit: Payer: Self-pay | Admitting: *Deleted

## 2022-08-25 NOTE — Patient Outreach (Signed)
  Care Coordination   08/25/2022 Name: Donna Casey MRN: [id number] DOB: August 10, 1961   Care Coordination Outreach Attempts:  An unsuccessful telephone outreach was attempted for a scheduled appointment today.  Follow Up Plan:  Additional outreach attempts will be made to offer the patient care coordination information and services.   Encounter Outcome:  No Answer  Care Coordination Interventions Activated:  No   Care Coordination Interventions:  No, not indicated    Valente David, RN, MSN, [facility] Care Management Care Management Coordinator [phone]

## 2022-09-14 ENCOUNTER — Telehealth: Payer: Self-pay | Admitting: *Deleted

## 2022-09-14 NOTE — Telephone Encounter (Signed)
Contacted patient on 09/07/2022 regarding PREP Class referral. She stated she would return my call if she was interested in participating in the November class. I have not heard back from her.

## 2022-09-17 ENCOUNTER — Ambulatory Visit: Payer: Self-pay | Admitting: *Deleted

## 2022-09-17 NOTE — Patient Outreach (Signed)
  Care Coordination   09/17/2022 Name: ALBIE BAZIN MRN: [id number] DOB: [birth year]   Care Coordination Outreach Attempts:  An unsuccessful telephone outreach was attempted for a scheduled appointment today.  Follow Up Plan:  Additional outreach attempts will be made to offer the patient care coordination information and services.   Encounter Outcome:  No Answer  Care Coordination Interventions Activated:  No   Care Coordination Interventions:  No, not indicated    Valente David, RN, MSN, [facility] Care Management Care Management Coordinator [phone]

## 2022-10-02 ENCOUNTER — Telehealth: Payer: Self-pay | Admitting: *Deleted

## 2022-10-02 NOTE — Progress Notes (Signed)
  Care Coordination Note  10/02/2022 Name: Donna Casey MRN: [id number] DOB: [birth year]  Donna Casey is a 61 y.o. year old female who is a primary care patient of Sasser, Silvestre Moment, MD and is actively engaged with the care management team. I reached out to Darlys Gales by phone today to assist with re-scheduling a follow up visit with the RN Case Manager  Follow up plan:  Patient declines further follow up and engagement by the care management team. Appropriate care team members and provider have been notified via electronic communication.   [place]  Direct Dial: [phone]

## 2022-10-23 DIAGNOSIS — R7301 Impaired fasting glucose: Secondary | ICD-10-CM | POA: Diagnosis not present

## 2022-10-23 DIAGNOSIS — R5383 Other fatigue: Secondary | ICD-10-CM | POA: Diagnosis not present

## 2022-10-23 DIAGNOSIS — K2100 Gastro-esophageal reflux disease with esophagitis, without bleeding: Secondary | ICD-10-CM | POA: Diagnosis not present

## 2022-10-23 DIAGNOSIS — I10 Essential (primary) hypertension: Secondary | ICD-10-CM | POA: Diagnosis not present

## 2022-10-23 DIAGNOSIS — E119 Type 2 diabetes mellitus without complications: Secondary | ICD-10-CM | POA: Diagnosis not present

## 2022-10-23 DIAGNOSIS — R739 Hyperglycemia, unspecified: Secondary | ICD-10-CM | POA: Diagnosis not present

## 2022-10-29 DIAGNOSIS — K5790 Diverticulosis of intestine, part unspecified, without perforation or abscess without bleeding: Secondary | ICD-10-CM | POA: Diagnosis not present

## 2022-10-29 DIAGNOSIS — M542 Cervicalgia: Secondary | ICD-10-CM | POA: Diagnosis not present

## 2022-10-29 DIAGNOSIS — Z23 Encounter for immunization: Secondary | ICD-10-CM | POA: Diagnosis not present

## 2022-10-29 DIAGNOSIS — K2100 Gastro-esophageal reflux disease with esophagitis, without bleeding: Secondary | ICD-10-CM | POA: Diagnosis not present

## 2022-10-29 DIAGNOSIS — I10 Essential (primary) hypertension: Secondary | ICD-10-CM | POA: Diagnosis not present

## 2022-10-29 DIAGNOSIS — Z0001 Encounter for general adult medical examination with abnormal findings: Secondary | ICD-10-CM | POA: Diagnosis not present

## 2022-10-29 DIAGNOSIS — E1165 Type 2 diabetes mellitus with hyperglycemia: Secondary | ICD-10-CM | POA: Diagnosis not present

## 2022-10-29 DIAGNOSIS — K219 Gastro-esophageal reflux disease without esophagitis: Secondary | ICD-10-CM | POA: Diagnosis not present

## 2022-12-16 DIAGNOSIS — Z1231 Encounter for screening mammogram for malignant neoplasm of breast: Secondary | ICD-10-CM | POA: Diagnosis not present

## 2023-02-16 DIAGNOSIS — Z13220 Encounter for screening for lipoid disorders: Secondary | ICD-10-CM | POA: Diagnosis not present

## 2023-02-16 DIAGNOSIS — K2100 Gastro-esophageal reflux disease with esophagitis, without bleeding: Secondary | ICD-10-CM | POA: Diagnosis not present

## 2023-02-16 DIAGNOSIS — E1165 Type 2 diabetes mellitus with hyperglycemia: Secondary | ICD-10-CM | POA: Diagnosis not present

## 2023-02-16 DIAGNOSIS — E782 Mixed hyperlipidemia: Secondary | ICD-10-CM | POA: Diagnosis not present

## 2023-02-24 DIAGNOSIS — M542 Cervicalgia: Secondary | ICD-10-CM | POA: Diagnosis not present

## 2023-02-24 DIAGNOSIS — K219 Gastro-esophageal reflux disease without esophagitis: Secondary | ICD-10-CM | POA: Diagnosis not present

## 2023-02-24 DIAGNOSIS — I10 Essential (primary) hypertension: Secondary | ICD-10-CM | POA: Diagnosis not present

## 2023-02-24 DIAGNOSIS — K5790 Diverticulosis of intestine, part unspecified, without perforation or abscess without bleeding: Secondary | ICD-10-CM | POA: Diagnosis not present

## 2023-02-24 DIAGNOSIS — K2100 Gastro-esophageal reflux disease with esophagitis, without bleeding: Secondary | ICD-10-CM | POA: Diagnosis not present

## 2023-02-24 DIAGNOSIS — E1165 Type 2 diabetes mellitus with hyperglycemia: Secondary | ICD-10-CM | POA: Diagnosis not present

## 2023-04-25 DIAGNOSIS — R030 Elevated blood-pressure reading, without diagnosis of hypertension: Secondary | ICD-10-CM | POA: Diagnosis not present

## 2023-04-25 DIAGNOSIS — B309 Viral conjunctivitis, unspecified: Secondary | ICD-10-CM | POA: Diagnosis not present

## 2023-06-24 DIAGNOSIS — Z13220 Encounter for screening for lipoid disorders: Secondary | ICD-10-CM | POA: Diagnosis not present

## 2023-06-24 DIAGNOSIS — I10 Essential (primary) hypertension: Secondary | ICD-10-CM | POA: Diagnosis not present

## 2023-06-24 DIAGNOSIS — E1165 Type 2 diabetes mellitus with hyperglycemia: Secondary | ICD-10-CM | POA: Diagnosis not present

## 2023-06-30 DIAGNOSIS — K5790 Diverticulosis of intestine, part unspecified, without perforation or abscess without bleeding: Secondary | ICD-10-CM | POA: Diagnosis not present

## 2023-06-30 DIAGNOSIS — I10 Essential (primary) hypertension: Secondary | ICD-10-CM | POA: Diagnosis not present

## 2023-06-30 DIAGNOSIS — E1165 Type 2 diabetes mellitus with hyperglycemia: Secondary | ICD-10-CM | POA: Diagnosis not present

## 2023-06-30 DIAGNOSIS — M542 Cervicalgia: Secondary | ICD-10-CM | POA: Diagnosis not present

## 2023-06-30 DIAGNOSIS — K219 Gastro-esophageal reflux disease without esophagitis: Secondary | ICD-10-CM | POA: Diagnosis not present

## 2023-11-02 DIAGNOSIS — K219 Gastro-esophageal reflux disease without esophagitis: Secondary | ICD-10-CM | POA: Diagnosis not present

## 2023-11-02 DIAGNOSIS — R5383 Other fatigue: Secondary | ICD-10-CM | POA: Diagnosis not present

## 2023-11-02 DIAGNOSIS — E119 Type 2 diabetes mellitus without complications: Secondary | ICD-10-CM | POA: Diagnosis not present

## 2023-11-02 DIAGNOSIS — E7849 Other hyperlipidemia: Secondary | ICD-10-CM | POA: Diagnosis not present

## 2023-11-11 DIAGNOSIS — E1122 Type 2 diabetes mellitus with diabetic chronic kidney disease: Secondary | ICD-10-CM | POA: Diagnosis not present

## 2023-11-11 DIAGNOSIS — R079 Chest pain, unspecified: Secondary | ICD-10-CM | POA: Diagnosis not present

## 2023-11-11 DIAGNOSIS — R000 Tachycardia, unspecified: Secondary | ICD-10-CM | POA: Diagnosis not present

## 2023-11-11 DIAGNOSIS — M542 Cervicalgia: Secondary | ICD-10-CM | POA: Diagnosis not present

## 2023-11-11 DIAGNOSIS — Z23 Encounter for immunization: Secondary | ICD-10-CM | POA: Diagnosis not present

## 2023-11-11 DIAGNOSIS — Z1389 Encounter for screening for other disorder: Secondary | ICD-10-CM | POA: Diagnosis not present

## 2023-11-11 DIAGNOSIS — N1831 Chronic kidney disease, stage 3a: Secondary | ICD-10-CM | POA: Diagnosis not present

## 2023-11-11 DIAGNOSIS — N183 Chronic kidney disease, stage 3 unspecified: Secondary | ICD-10-CM | POA: Diagnosis not present

## 2023-11-11 DIAGNOSIS — I10 Essential (primary) hypertension: Secondary | ICD-10-CM | POA: Diagnosis not present

## 2023-11-11 DIAGNOSIS — K2100 Gastro-esophageal reflux disease with esophagitis, without bleeding: Secondary | ICD-10-CM | POA: Diagnosis not present

## 2023-11-11 DIAGNOSIS — E1165 Type 2 diabetes mellitus with hyperglycemia: Secondary | ICD-10-CM | POA: Diagnosis not present

## 2023-11-26 ENCOUNTER — Ambulatory Visit (HOSPITAL_BASED_OUTPATIENT_CLINIC_OR_DEPARTMENT_OTHER): Payer: Medicare Other | Admitting: Cardiology

## 2023-11-26 ENCOUNTER — Encounter (HOSPITAL_BASED_OUTPATIENT_CLINIC_OR_DEPARTMENT_OTHER): Payer: Self-pay | Admitting: Cardiology

## 2023-11-26 ENCOUNTER — Other Ambulatory Visit (HOSPITAL_BASED_OUTPATIENT_CLINIC_OR_DEPARTMENT_OTHER): Payer: Medicare Other

## 2023-11-26 VITALS — BP 138/92 | HR 108 | Ht 69.0 in | Wt 256.3 lb

## 2023-11-26 DIAGNOSIS — R000 Tachycardia, unspecified: Secondary | ICD-10-CM | POA: Diagnosis not present

## 2023-11-26 DIAGNOSIS — Z7189 Other specified counseling: Secondary | ICD-10-CM | POA: Diagnosis not present

## 2023-11-26 DIAGNOSIS — I10 Essential (primary) hypertension: Secondary | ICD-10-CM

## 2023-11-26 DIAGNOSIS — R002 Palpitations: Secondary | ICD-10-CM | POA: Diagnosis not present

## 2023-11-26 DIAGNOSIS — E119 Type 2 diabetes mellitus without complications: Secondary | ICD-10-CM | POA: Diagnosis not present

## 2023-11-26 DIAGNOSIS — R079 Chest pain, unspecified: Secondary | ICD-10-CM

## 2023-11-26 NOTE — Progress Notes (Signed)
Cardiology Office Note:  .   Date:  11/26/2023  ID:  Donna Casey, DOB 01-17-1961, MRN [id number] PCP: Estanislado Pandy, MD  [facility] Providers Cardiologist:  Jodelle Red, MD {  History of Present Illness: .   Donna Casey is a 62 y.o. female type II diabetes, hypertension referred for chest pain and tachycardia.  Today: Here with her sister today.  Reviewed records received from [facility]. Referral is for tachycardia/chest pain, noted in ROS but no further information available in notes. ECG present and reviewed, SR at 95 bpm  She reports that at her recent scheduled visit, her heart rate was 126 bpm. She notes that at night, her heart will be racing and wake her up at night. She also notes worsening sweating recently, feels that her heart races with these events as well.  Notes that her heart rate can jump up to 155 bpm when walking.  Chest pain has been going on for 2-3 weeks. Pain is central, worse when she presses on it, feels like a tightness. Pain is constant, never goes away. Does not radiate. Improves when she sits back, stretches her rib cage, and takes some deep breath. Feels occasionally nauseated with this. She does note a history of reflex and has been weaning off her omeprazole, now taking every other day. She is not sure if the nausea is worse on days where she hasn't taken the omeprazole in >24 hours.  Has been on Ozempic for several years, stopped for a time due to diverticulitis, but has been on this dose for over a year without a dose change.  Family history: mother had heart disease, started having symptoms in her 34s. Had CABG around age 25. No one else in her family with known heart disease.  Has had diabetes for 3-4 years. Never smoker. Has had hypertension for at least several years as well.   ROS: Denies chest pain, shortness of breath at rest or with normal exertion. No PND, orthopnea, LE edema or unexpected weight  gain. No syncope or palpitations. ROS otherwise negative except as noted.   Studies Reviewed: Marland Kitchen    EKG:  EKG Interpretation Date/Time:  Friday November 26 2023 15:40:02 EST Ventricular Rate:  95 PR Interval:  138 QRS Duration:  84 QT Interval:  372 QTC Calculation: 467 R Axis:   34  Text Interpretation: Normal sinus rhythm Confirmed by Jodelle Red [phone]) on 11/26/2023 5:35:19 PM    Physical Exam:   VS:  BP (!) 138/92   Pulse (!) 108   Ht 5\' 9"  (1.753 m)   Wt 256 lb 4.8 oz (116.3 kg)   SpO2 99%   BMI 37.85 kg/m    Wt Readings from Last 3 Encounters:  11/26/23 256 lb 4.8 oz (116.3 kg)  06/23/22 246 lb 6.4 oz (111.8 kg)  03/23/22 245 lb 3.2 oz (111.2 kg)    GEN: Well nourished, well developed in no acute distress HEENT: Normal, moist mucous membranes NECK: No JVD CARDIAC: regular rhythm, normal S1 and S2, no rubs or gallops. No murmur. VASCULAR: Radial and DP pulses 2+ bilaterally. No carotid bruits RESPIRATORY:  Clear to auscultation without rales, wheezing or rhonchi  ABDOMEN: Soft, non-tender, non-distended MUSCULOSKELETAL:  Ambulates independently SKIN: Warm and dry, no edema NEUROLOGIC:  Alert and oriented x 3. No focal neuro deficits noted. PSYCHIATRIC:  Normal affect    ASSESSMENT AND PLAN: .    Chest pain -constant, somewhat atypical in nature, but does have  significant risk factors -discussed treadmill stress, nuclear stress/lexiscan, and CT coronary angiography. Discussed pros and cons of each, including but not limited to false positive/false negative risk, radiation risk, and risk of IV contrast dye. Based on shared decision making, decision was made to pursue cardiac PET scan.  Informed Consent   Shared Decision Making/Informed Consent The risks [chest pain, shortness of breath, cardiac arrhythmias, dizziness, blood pressure fluctuations, myocardial infarction, stroke/transient ischemic attack, nausea, vomiting, allergic reaction, radiation  exposure, metallic taste sensation and life-threatening complications (estimated to be 1 in 10,000)], benefits (risk stratification, diagnosing coronary artery disease, treatment guidance) and alternatives of a cardiac PET stress test were discussed in detail with [name] and she agrees to proceed.     -reviewed red flag warning signs that need immediate medical attention   Sinus tachycardia Palpitations -discussed maximum target heart rate, sinus rates vs. Arrhythmias, patterns, management options -will get 2 week Zio monitor, instructed on use  Type II diabetes  -continue metformin, ozempic  Hypertension -monitor at home, if elevated will adjust meds at follow up  CV risk counseling and prevention -recommend heart healthy/Mediterranean diet, with whole grains, fruits, vegetable, fish, lean meats, nuts, and olive oil. Limit salt. -recommend moderate walking, 3-5 times/week for 30-50 minutes each session. Aim for at least 150 minutes.week. Goal should be pace of 3 miles/hours, or walking 1.5 miles in 30 minutes -recommend avoidance of tobacco products. Avoid excess alcohol.  Dispo: 6-8 weeks  Signed, Jodelle Red, MD   Jodelle Red, MD, PhD, [facility]  [facility]  [place]  Heart & Vascular at [facility] at [facility] [address], [address] [place], [place] [address] [phone]

## 2023-11-26 NOTE — Patient Instructions (Addendum)
Medication Instructions:  Your physician recommends that you continue on your current medications as directed. Please refer to the Current Medication list given to you today.   *If you need a refill on your cardiac medications before your next appointment, please call your pharmacy*  Lab Work: NONE   Testing/Procedures: 14 DAY ZIO MONITOR   CARDIAC PET SCAN  WILL CALL YOU TO SCHEDULE ONCE INSURANCE HAS BEEN REVIEWED   Follow-Up: 01/05/2024 11:40 AM WITH DR Cristal Deer   Keep a log of what makes your symptoms better/worse, time of day, etc.     Please report to Radiology at the [facility] Main Entrance 30 minutes early for your test.  [address] [place], [place] [address]                         OR   Please report to Radiology at [facility] Main Entrance, medical mall, 30 mins prior to your test.  [address]  [place], [place]  [phone]  How to Prepare for Your Cardiac PET/CT Stress Test:  Nothing to eat or drink, except water, 3 hours prior to arrival time.  NO caffeine/decaffeinated products, or chocolate 12 hours prior to arrival. (Please note decaffeinated beverages (teas/coffees) still contain caffeine).  If you have caffeine within 12 hours prior, the test will need to be rescheduled.  Medication instructions: Do not take erectile dysfunction medications for 72 hours prior to test (sildenafil, tadalafil) Do not take nitrates (isosorbide mononitrate, Ranexa) the day before or day of test Do not take tamsulosin the day before or morning of test Hold theophylline containing medications for 12 hours. Hold Dipyridamole 48 hours prior to the test.  Diabetic Preparation: If able to eat breakfast prior to 3 hour fasting, you may take all medications, including your insulin. Do not worry if you miss your breakfast dose of insulin - start at your next meal. If you do not eat prior to 3 hour fast-Hold all diabetes (oral and  insulin) medications. Patients who wear a continuous glucose monitor MUST remove the device prior to scanning.  You may take your remaining medications with water.  NO perfume, cologne or lotion on chest or abdomen area. FEMALES - Please avoid wearing dresses to this appointment.  Total time is 1 to 2 hours; you may want to bring reading material for the waiting time.  IF YOU THINK YOU MAY BE PREGNANT, OR ARE NURSING PLEASE INFORM THE TECHNOLOGIST.  In preparation for your appointment, medication and supplies will be purchased.  Appointment availability is limited, so if you need to cancel or reschedule, please call the Radiology Department at [phone] [facility]) OR [phone] [facility]) 24 hours in advance to avoid a cancellation fee of $100.00  What to Expect When you Arrive:  Once you arrive and check in for your appointment, you will be taken to a preparation room within the Radiology Department.  A technologist or Nurse will obtain your medical history, verify that you are correctly prepped for the exam, and explain the procedure.  Afterwards, an IV will be started in your arm and electrodes will be placed on your skin for EKG monitoring during the stress portion of the exam. Then you will be escorted to the PET/CT scanner.  There, staff will get you positioned on the scanner and obtain a blood pressure and EKG.  During the exam, you will continue to be connected to the EKG and blood pressure  machines.  A small, safe amount of a radioactive tracer will be injected in your IV to obtain a series of pictures of your heart along with an injection of a stress agent.    After your Exam:  It is recommended that you eat a meal and drink a caffeinated beverage to counter act any effects of the stress agent.  Drink plenty of fluids for the remainder of the day and urinate frequently for the first couple of hours after the exam.  Your doctor will inform you of your test results within 7-10  business days.  For more information and frequently asked questions, please visit our website: [email or web address]  For questions about your test or how to prepare for your test, please call: Cardiac Imaging Nurse Navigators Office: [phone]  Nuclear Medicine Exam A nuclear medicine exam is a safe and painless imaging test. It helps your health care provider detect and diagnose diseases. It also provides information about the ways your organs work and how they are structured. For a nuclear medicine exam, you will be given a radioactive material, called a tracer, that is absorbed by your body's organs. A large scanning machine detects the radioactive tracer and creates pictures of the areas that your health care provider wants to know more about. There are several kinds of nuclear medicine exams. They include the following: CT scan. MRI scan. PET scan. SPECT scan. Tell your health care provider about: Any allergies you have. All medicines you are taking, including vitamins, herbs, eye drops, creams, and over-the-counter medicines. Any problems you or family members have had with anesthetic medicines. Any blood disorders you have. Any surgeries you have had. Any medical conditions you have. Whether you are pregnant, may be pregnant, or are breastfeeding. What are the risks? Generally, this is a safe procedure. However, problems may occur, such as: An allergic reaction to the tracer. This is rare. Exposure to radiation (a small amount). What happens before the procedure? Medicines Ask your health care provider about: Changing or stopping your regular medicines. This is especially important if you are taking diabetes medicines or blood thinners. Taking medicines such as aspirin and ibuprofen. These medicines can thin your blood. Do not take these medicines unless your health care provider tells you to take them. Taking over-the-counter medicines, vitamins, herbs, and  supplements. General instructions Follow instructions from your health care provider about eating and drinking restrictions. Do not wear jewelry. Wear loose, comfortable clothing. You may be asked to wear a hospital gown for the procedure. Bring previous imaging studies, such as X-rays, with you to the exam if they are available. What happens during the procedure?  An IV may be inserted into one of your veins. You will be asked to lie on a table or sit in a chair. You will be given the radioactive tracer. You may get: A pill or liquid to swallow. An injection. Medicine through your IV. A gas to inhale. A large scanning machine will be used to create images of your body. After the pictures are taken, you may have to wait until your health care provider can make sure that enough images were taken. The procedure may vary among health care providers and hospitals. What can I expect after the procedure? It is up to you to get the results of your procedure. Ask your health care provider, or the department that is doing the procedure, when your results will be ready. Also ask: How will I get my results? What are my treatment  options? What other tests do I need? What are my next steps? Follow these instructions at home: Drink enough water to keep your urine pale yellow (6-8 glasses). This helps to remove the radioactive tracer from your body. You may return to your normal activities as told by your health care provider. Get help right away if you: Have problems breathing. This symptom may represent a serious problem that is an emergency. Do not wait to see if the symptoms will go away. Get medical help right away. Call your local emergency services (911 in the [place] Do not drive yourself to the hospital. Summary A nuclear medicine exam is a safe and painless imaging test. It provides information about how your organs are working. It is also used to detect and diagnose diseases. During the  procedure, you will be given a radioactive tracer. A large scanning machine will create images of your body. You may resume your normal activities after the procedure. Follow your health care provider's instructions. Get help right away if you have problems breathing. This information is not intended to replace advice given to you by your health care provider. Make sure you discuss any questions you have with your health care provider. Document Revised: 07/30/2021 Document Reviewed: 03/23/2020 Elsevier Patient Education  2024 [employer].

## 2023-12-21 ENCOUNTER — Encounter (HOSPITAL_BASED_OUTPATIENT_CLINIC_OR_DEPARTMENT_OTHER): Payer: Self-pay

## 2023-12-21 DIAGNOSIS — R079 Chest pain, unspecified: Secondary | ICD-10-CM | POA: Diagnosis not present

## 2023-12-21 DIAGNOSIS — R002 Palpitations: Secondary | ICD-10-CM | POA: Diagnosis not present

## 2023-12-27 ENCOUNTER — Telehealth (HOSPITAL_COMMUNITY): Payer: Self-pay | Admitting: *Deleted

## 2023-12-27 NOTE — Telephone Encounter (Signed)

## 2023-12-28 ENCOUNTER — Encounter: Payer: Self-pay | Admitting: *Deleted

## 2023-12-28 ENCOUNTER — Encounter (HOSPITAL_COMMUNITY)
Admission: RE | Admit: 2023-12-28 | Discharge: 2023-12-28 | Disposition: A | Discharge status: Home / Self Care | Payer: Medicare Other | Source: Ambulatory Visit | Attending: Cardiology | Admitting: Cardiology

## 2023-12-28 DIAGNOSIS — R079 Chest pain, unspecified: Secondary | ICD-10-CM

## 2023-12-28 DIAGNOSIS — R002 Palpitations: Secondary | ICD-10-CM | POA: Diagnosis not present

## 2023-12-28 DIAGNOSIS — I10 Essential (primary) hypertension: Secondary | ICD-10-CM | POA: Diagnosis not present

## 2023-12-28 DIAGNOSIS — R000 Tachycardia, unspecified: Secondary | ICD-10-CM | POA: Diagnosis not present

## 2023-12-28 LAB — NM PET CT CARDIAC PERFUSION MULTI W/ABSOLUTE BLOODFLOW
LV dias vol: 90 mL (ref 46–106)
LV sys vol: 36 mL
MBFR: 1.95
Nuc Rest EF: 54 %
Nuc Stress EF: 60 %
Peak HR: 109 {beats}/min
Rest HR: 88 {beats}/min
Rest MBF: 1.08 ml/g/min
Rest Nuclear Isotope Dose: 30 mCi
ST Depression (mm): 0 mm
Stress MBF: 2.11 ml/g/min
Stress Nuclear Isotope Dose: 30 mCi
TID: 0.94

## 2023-12-28 MED ORDER — DEXTROSE 5 % IV SOLN
INTRAVENOUS | Status: AC
Start: 2023-12-28 — End: ?
  Filled 2023-12-28: qty 50

## 2023-12-28 MED ORDER — REGADENOSON 0.4 MG/5ML IV SOLN
INTRAVENOUS | Status: AC
  Filled 2023-12-28: qty 5

## 2023-12-28 MED ORDER — RUBIDIUM RB82 GENERATOR (RUBYFILL)
29.9900 | PACK | Freq: Once | INTRAVENOUS | Status: AC
Start: 2023-12-28 — End: 2023-12-28
  Administered 2023-12-28: 29.99 via INTRAVENOUS

## 2023-12-28 MED ORDER — CAFFEINE CITRATE BASE COMPONENT 10 MG/ML IV SOLN
INTRAVENOUS | Status: AC
  Filled 2023-12-28: qty 3

## 2023-12-28 MED ORDER — RUBIDIUM RB82 GENERATOR (RUBYFILL)
29.9500 | PACK | Freq: Once | INTRAVENOUS | Status: AC
  Administered 2023-12-28: 29.95 via INTRAVENOUS

## 2023-12-28 MED ORDER — REGADENOSON 0.4 MG/5ML IV SOLN
0.4000 mg | Freq: Once | INTRAVENOUS | Status: AC
  Administered 2023-12-28: 0.4 mg via INTRAVENOUS

## 2023-12-28 NOTE — Patient Outreach (Signed)
  Care Coordination   Follow Up Visit Note   12/28/2023 Name: Donna Casey MRN: [id number] DOB: 12-13-1960  Donna Casey is a 63 y.o. year old female who sees Sasser, Clarene Critchley, MD for primary care.   Patient declined to have further outreach calls.    Goals Addressed               This Visit's Progress     COMPLETED: Stay active (increase exercise) and decrease risk of complications of diverticulitis (pt-stated)        Care Coordination Interventions: Advised patient to consider joining gym (discussed PREP program at [facility], will consider if there is no cost) Provided education to patient re: foods that are good for diverticulitis Reviewed scheduled/upcoming provider appointments including need for AWV Discussed plans with patient for ongoing care management follow up and provided patient with direct contact information for care management team Assessed social determinant of health barriers         SDOH assessments and interventions completed:  No     Care Coordination Interventions:  No, not indicated   Follow up plan: No further intervention required.   Encounter Outcome:  Patient Visit Completed   Rodney Langton, RN, MSN, CCM [place]  [facility], [facility] RN Care Coordinator Direct Dial: [phone] / Main [phone] Fax [phone] Email: [email or web address]: [email or web address]

## 2024-01-05 ENCOUNTER — Encounter (HOSPITAL_BASED_OUTPATIENT_CLINIC_OR_DEPARTMENT_OTHER): Payer: Self-pay | Admitting: Cardiology

## 2024-01-05 ENCOUNTER — Ambulatory Visit (HOSPITAL_BASED_OUTPATIENT_CLINIC_OR_DEPARTMENT_OTHER): Payer: Medicare Other | Admitting: Cardiology

## 2024-01-05 VITALS — BP 136/84 | HR 105 | Ht 69.0 in | Wt 254.7 lb

## 2024-01-05 DIAGNOSIS — R002 Palpitations: Secondary | ICD-10-CM | POA: Diagnosis not present

## 2024-01-05 DIAGNOSIS — Z7189 Other specified counseling: Secondary | ICD-10-CM | POA: Diagnosis not present

## 2024-01-05 DIAGNOSIS — E119 Type 2 diabetes mellitus without complications: Secondary | ICD-10-CM | POA: Diagnosis not present

## 2024-01-05 DIAGNOSIS — I10 Essential (primary) hypertension: Secondary | ICD-10-CM

## 2024-01-05 DIAGNOSIS — Z712 Person consulting for explanation of examination or test findings: Secondary | ICD-10-CM | POA: Diagnosis not present

## 2024-01-05 DIAGNOSIS — R000 Tachycardia, unspecified: Secondary | ICD-10-CM | POA: Diagnosis not present

## 2024-01-05 DIAGNOSIS — R0789 Other chest pain: Secondary | ICD-10-CM

## 2024-01-05 NOTE — Patient Instructions (Addendum)
Medication Instructions:  Your physician recommends that you continue on your current medications as directed. Please refer to the Current Medication list given to you today.  *If you need a refill on your cardiac medications before your next appointment, please call your pharmacy*   Follow-Up: At [facility], you and your health needs are our priority.  As part of our continuing mission to provide you with exceptional heart care, we have created designated Provider Care Teams.  These Care Teams include your primary Cardiologist (physician) and Advanced Practice Providers (APPs -  Physician Assistants and Nurse Practitioners) who all work together to provide you with the care you need, when you need it.  We recommend signing up for the patient portal called "MyChart".  Sign up information is provided on this After Visit Summary.  MyChart is used to connect with patients for Virtual Visits (Telemedicine).  Patients are able to view lab/test results, encounter notes, upcoming appointments, etc.  Non-urgent messages can be sent to your provider as well.   To learn more about what you can do with MyChart, go to [email or web address].    Your next appointment:   As needed  Provider:   Bridgette Christopher, MD     

## 2024-01-05 NOTE — Progress Notes (Signed)
  Cardiology Office Note:  .   Date:  01/05/2024  ID:  Donna Casey, DOB 01-20-1961, MRN [id number] PCP: Atilano Deward ORN, MD  [facility] Providers Cardiologist:  Shelda Bruckner, MD {  History of Present Illness: .   Donna Casey is a 63 y.o. female type II diabetes, hypertension referred for chest pain and tachycardia. Initially seen 11/26/23.  CV history: referred for chest pain and tachycardia. Cardiac PET and monitor reassuring. With T2D, on Ozempic. Has had diabetes for 3-4 years. Never smoker. Has had hypertension for at least several years as well.   Family history: mother had heart disease, started having symptoms in her 31s. Had CABG around age 78. No one else in her family with known heart disease.  Today: Overall doing well. Reviewed her PET scan and monitor. Very reassuring results. No change in her chest pain. No new symptoms.  ROS: Denies shortness of breath at rest or with normal exertion. No PND, orthopnea, LE edema or unexpected weight gain. No syncope. ROS otherwise negative except as noted.   Studies Reviewed: SABRA    EKG:       Physical Exam:   VS:  BP 138/86   Pulse (!) 105   Ht 5' 9 (1.753 m)   Wt 254 lb 11.2 oz (115.5 kg)   SpO2 98%   BMI 37.61 kg/m    Wt Readings from Last 3 Encounters:  01/05/24 254 lb 11.2 oz (115.5 kg)  11/26/23 256 lb 4.8 oz (116.3 kg)  06/23/22 246 lb 6.4 oz (111.8 kg)    GEN: Well nourished, well developed in no acute distress HEENT: Normal, moist mucous membranes NECK: No JVD CARDIAC: regular rhythm, normal S1 and S2, no rubs or gallops. No murmur. VASCULAR: Radial and DP pulses 2+ bilaterally. No carotid bruits RESPIRATORY:  Clear to auscultation without rales, wheezing or rhonchi  ABDOMEN: Soft, non-tender, non-distended MUSCULOSKELETAL:  Ambulates independently SKIN: Warm and dry, no edema NEUROLOGIC:  Alert and oriented x 3. No focal neuro deficits noted. PSYCHIATRIC:  Normal affect    ASSESSMENT  AND PLAN: .    Chest pain -reviewed her cardiac PET, which is very reassuring -reviewed noncardiac etiologies of chest pain -reviewed red flag warning signs that need immediate medical attention   Sinus tachycardia Palpitations -discussed maximum target heart rate, sinus rates vs. Arrhythmias, patterns, management options -reviewed her monitor -discussed increasing cardiovascular activity to increase her vagal tone, which will lower her resting heart rate  Type II diabetes  -continue metformin , ozempic -hepatic steatosis noted on PET scan -continue rosuvastatin  Hypertension -just above goal today -discussed increasing meds. She will work on lifestyle changes first -for now, continue amlodipine , hydrochlorothiazide , losartan   CV risk counseling and prevention -recommend heart healthy/Mediterranean diet, with whole grains, fruits, vegetable, fish, lean meats, nuts, and olive oil. Limit salt. -recommend moderate walking, 3-5 times/week for 30-50 minutes each session. Aim for at least 150 minutes.week. Goal should be pace of 3 miles/hours, or walking 1.5 miles in 30 minutes -recommend avoidance of tobacco products. Avoid excess alcohol .  Dispo: I would be happy to see her back as needed   Signed, Shelda Bruckner, MD   Shelda Bruckner, MD, PhD, [facility] [place]  [facility]    Heart & Vascular at [facility] at [facility] [address] [place], [place] [address] [phone]

## 2024-01-12 ENCOUNTER — Other Ambulatory Visit (HOSPITAL_COMMUNITY): Payer: Medicare Other

## 2024-01-18 DIAGNOSIS — Z1231 Encounter for screening mammogram for malignant neoplasm of breast: Secondary | ICD-10-CM | POA: Diagnosis not present

## 2024-03-09 DIAGNOSIS — Z13220 Encounter for screening for lipoid disorders: Secondary | ICD-10-CM | POA: Diagnosis not present

## 2024-03-09 DIAGNOSIS — E1165 Type 2 diabetes mellitus with hyperglycemia: Secondary | ICD-10-CM | POA: Diagnosis not present

## 2024-03-09 DIAGNOSIS — N183 Chronic kidney disease, stage 3 unspecified: Secondary | ICD-10-CM | POA: Diagnosis not present

## 2024-03-16 DIAGNOSIS — I10 Essential (primary) hypertension: Secondary | ICD-10-CM | POA: Diagnosis not present

## 2024-03-16 DIAGNOSIS — E1165 Type 2 diabetes mellitus with hyperglycemia: Secondary | ICD-10-CM | POA: Diagnosis not present

## 2024-03-16 DIAGNOSIS — N1831 Chronic kidney disease, stage 3a: Secondary | ICD-10-CM | POA: Diagnosis not present

## 2024-06-10 DIAGNOSIS — B372 Candidiasis of skin and nail: Secondary | ICD-10-CM | POA: Diagnosis not present

## 2024-06-10 DIAGNOSIS — Z7984 Long term (current) use of oral hypoglycemic drugs: Secondary | ICD-10-CM | POA: Diagnosis not present

## 2024-06-10 DIAGNOSIS — R1032 Left lower quadrant pain: Secondary | ICD-10-CM | POA: Diagnosis not present

## 2024-06-10 DIAGNOSIS — K5792 Diverticulitis of intestine, part unspecified, without perforation or abscess without bleeding: Secondary | ICD-10-CM | POA: Diagnosis not present

## 2024-06-10 DIAGNOSIS — K5732 Diverticulitis of large intestine without perforation or abscess without bleeding: Secondary | ICD-10-CM | POA: Diagnosis not present

## 2024-06-10 DIAGNOSIS — L309 Dermatitis, unspecified: Secondary | ICD-10-CM | POA: Diagnosis not present

## 2024-06-10 DIAGNOSIS — E119 Type 2 diabetes mellitus without complications: Secondary | ICD-10-CM | POA: Diagnosis not present

## 2024-06-10 DIAGNOSIS — R110 Nausea: Secondary | ICD-10-CM | POA: Diagnosis not present

## 2024-06-10 DIAGNOSIS — I10 Essential (primary) hypertension: Secondary | ICD-10-CM | POA: Diagnosis not present

## 2024-06-13 DIAGNOSIS — K5732 Diverticulitis of large intestine without perforation or abscess without bleeding: Secondary | ICD-10-CM | POA: Diagnosis not present

## 2024-06-28 DIAGNOSIS — R21 Rash and other nonspecific skin eruption: Secondary | ICD-10-CM | POA: Diagnosis not present

## 2024-06-28 DIAGNOSIS — L270 Generalized skin eruption due to drugs and medicaments taken internally: Secondary | ICD-10-CM | POA: Diagnosis not present

## 2024-06-28 DIAGNOSIS — Z6822 Body mass index (BMI) 22.0-22.9, adult: Secondary | ICD-10-CM | POA: Diagnosis not present

## 2024-07-06 DIAGNOSIS — M48061 Spinal stenosis, lumbar region without neurogenic claudication: Secondary | ICD-10-CM | POA: Diagnosis not present

## 2024-07-06 DIAGNOSIS — Z792 Long term (current) use of antibiotics: Secondary | ICD-10-CM | POA: Diagnosis not present

## 2024-07-06 DIAGNOSIS — M47816 Spondylosis without myelopathy or radiculopathy, lumbar region: Secondary | ICD-10-CM | POA: Diagnosis not present

## 2024-07-06 DIAGNOSIS — R079 Chest pain, unspecified: Secondary | ICD-10-CM | POA: Diagnosis not present

## 2024-07-06 DIAGNOSIS — H81399 Other peripheral vertigo, unspecified ear: Secondary | ICD-10-CM | POA: Diagnosis not present

## 2024-07-06 DIAGNOSIS — Z9181 History of falling: Secondary | ICD-10-CM | POA: Diagnosis not present

## 2024-07-06 DIAGNOSIS — Z96611 Presence of right artificial shoulder joint: Secondary | ICD-10-CM | POA: Diagnosis not present

## 2024-07-06 DIAGNOSIS — Z7985 Long-term (current) use of injectable non-insulin antidiabetic drugs: Secondary | ICD-10-CM | POA: Diagnosis not present

## 2024-07-06 DIAGNOSIS — R42 Dizziness and giddiness: Secondary | ICD-10-CM | POA: Diagnosis not present

## 2024-07-06 DIAGNOSIS — R0789 Other chest pain: Secondary | ICD-10-CM | POA: Diagnosis not present

## 2024-07-06 DIAGNOSIS — Z96641 Presence of right artificial hip joint: Secondary | ICD-10-CM | POA: Diagnosis not present

## 2024-07-06 DIAGNOSIS — R21 Rash and other nonspecific skin eruption: Secondary | ICD-10-CM | POA: Diagnosis not present

## 2024-07-06 DIAGNOSIS — R2681 Unsteadiness on feet: Secondary | ICD-10-CM | POA: Diagnosis not present

## 2024-07-06 DIAGNOSIS — Z96652 Presence of left artificial knee joint: Secondary | ICD-10-CM | POA: Diagnosis not present

## 2024-07-06 DIAGNOSIS — Z8719 Personal history of other diseases of the digestive system: Secondary | ICD-10-CM | POA: Diagnosis not present

## 2024-07-06 DIAGNOSIS — M5137 Other intervertebral disc degeneration, lumbosacral region with discogenic back pain only: Secondary | ICD-10-CM | POA: Diagnosis not present

## 2024-07-06 DIAGNOSIS — R2689 Other abnormalities of gait and mobility: Secondary | ICD-10-CM | POA: Diagnosis not present

## 2024-07-06 DIAGNOSIS — K219 Gastro-esophageal reflux disease without esophagitis: Secondary | ICD-10-CM | POA: Diagnosis not present

## 2024-07-06 DIAGNOSIS — E860 Dehydration: Secondary | ICD-10-CM | POA: Diagnosis not present

## 2024-07-06 DIAGNOSIS — Z7984 Long term (current) use of oral hypoglycemic drugs: Secondary | ICD-10-CM | POA: Diagnosis not present

## 2024-07-06 DIAGNOSIS — E876 Hypokalemia: Secondary | ICD-10-CM | POA: Diagnosis not present

## 2024-07-06 DIAGNOSIS — N3001 Acute cystitis with hematuria: Secondary | ICD-10-CM | POA: Diagnosis not present

## 2024-07-06 DIAGNOSIS — R739 Hyperglycemia, unspecified: Secondary | ICD-10-CM | POA: Diagnosis not present

## 2024-07-06 DIAGNOSIS — E1165 Type 2 diabetes mellitus with hyperglycemia: Secondary | ICD-10-CM | POA: Diagnosis not present

## 2024-07-06 DIAGNOSIS — I10 Essential (primary) hypertension: Secondary | ICD-10-CM | POA: Diagnosis not present

## 2024-07-06 DIAGNOSIS — R5383 Other fatigue: Secondary | ICD-10-CM | POA: Diagnosis not present

## 2024-07-06 DIAGNOSIS — Z885 Allergy status to narcotic agent status: Secondary | ICD-10-CM | POA: Diagnosis not present

## 2024-07-06 DIAGNOSIS — Z7982 Long term (current) use of aspirin: Secondary | ICD-10-CM | POA: Diagnosis not present

## 2024-07-06 DIAGNOSIS — N390 Urinary tract infection, site not specified: Secondary | ICD-10-CM | POA: Diagnosis not present

## 2024-07-06 DIAGNOSIS — Z79899 Other long term (current) drug therapy: Secondary | ICD-10-CM | POA: Diagnosis not present

## 2024-07-06 DIAGNOSIS — M4807 Spinal stenosis, lumbosacral region: Secondary | ICD-10-CM | POA: Diagnosis not present

## 2024-07-09 DIAGNOSIS — N3001 Acute cystitis with hematuria: Secondary | ICD-10-CM | POA: Diagnosis not present

## 2024-07-09 DIAGNOSIS — R079 Chest pain, unspecified: Secondary | ICD-10-CM | POA: Diagnosis not present

## 2024-07-09 DIAGNOSIS — R739 Hyperglycemia, unspecified: Secondary | ICD-10-CM | POA: Diagnosis not present

## 2024-07-10 DIAGNOSIS — R079 Chest pain, unspecified: Secondary | ICD-10-CM | POA: Diagnosis not present

## 2024-07-11 DIAGNOSIS — N390 Urinary tract infection, site not specified: Secondary | ICD-10-CM | POA: Diagnosis not present

## 2024-07-11 DIAGNOSIS — E1165 Type 2 diabetes mellitus with hyperglycemia: Secondary | ICD-10-CM | POA: Diagnosis not present

## 2024-07-11 DIAGNOSIS — R079 Chest pain, unspecified: Secondary | ICD-10-CM | POA: Diagnosis not present

## 2024-07-11 NOTE — Discharge Summary (Signed)
 DISCHARGE SUMMARY [facility]   Discharge date:   July 11, 2024 Length of stay:    LOS: 1 day    Discharge Service:   [facility] Hospitalists Discharge Attending Physician: Margart Elsie Dragon, DO Discharge to:    To Home with outpatient PT Condition at Discharge:  good Code status:                         Full Code    ______________________________________  Admission HPI  Patient admitted on: 07/09/2024  8:38 PM  Patient admitted by: Thersia Roger, D.O.   CHIEF COMPLAINT:  Dizziness, hyperglycemia   Day of admission HPI:  Donna Casey  is a 63 y.o. female with a PMH significant for DM, HTN who presented with hyperglycemia.  History provided by patient and daughter's at bedside.    Patient has had a complicated recent history - In July she was diagnosed with diverticulitis for which she was started on Cipro/Flagyl.  She developed a rash from one of those and was then started on prednisone.  Her blood sugars spiked due to prednisone.  She was seen in the ER three days ago for hyperglycemia thought to be related to prednisone.  Had a positive UA at that time.     Today complains of dizziness, headache, chest pain, blurry vision, unsteady gait.  Reports that her blood glucose was over 500 at home prior to arrival.  Also reports persistent urinary symptoms including frequency, incomplete bladder emptying, dysuria. Has had diarrhea with the diverticulitis but that has resolved.   In the ED the patient received insulin, labetalol, lorazepam, ondansetron , NS.   Medical admission was requested for further workup and management of intractable dizziness.      Patient admitted on Home O2? - No Patient on home anticoagulant? -  No Patient admitted with Chronic home foley catheter? - No Foley catheter placed or replaced by another service prior to admission? - No Central Line Status: None   Mental Status on Admission: The patient IS Alert and oriented to PERSON The  patient IS  Alert And oriented to TIME The patient IS Alert and oriented to LOCATION   Problem List, Assessment & Plan     ASSESSMENT & PLAN (In order of descending acuity)   63 y.o. female with a PMH significant for HTN, DM now admitted with multiple complaints:   Hyperglycemia Poorly controlled type 2 diabetes mellitus -Patient symptomatic with blurry vision, dizziness, weakness -Glucose at home and in ER over 500, down to 215 after insulin administration in ER -Placed on a carb controlled diet, Accu-Cheks with sliding scale insulin, metformin  held during hospital stay, hemoglobin A1c checked and resulted 10.5% - Patient reports A1c 6.2 around 3 to 4 months ago, and since she had been asymptomatic she had stopped checking her blood sugar regularly. - Discussed going home with sliding scale insulin and close follow-up with PCP, discussed all symptoms related to hyper and hypoglycemia. - Patient to have her own glucometer, test strips, lancets, and insulin supplies (was using her husband's)   Intractable dizziness, possibly related to above Patient symptomatic with dizziness, gait instability -Head CT and MRI both largely unremarkable, some sequela on MRI possibly related to migraines (which were reported by patient) - Received IV fluids with improvement in symptoms - Will plan for as needed meclizine at home   UTI Symptoms have resolved Initially symptomatic with frequency, incomplete bladder emotying, dysuria -UA with small LE, UA from  8/7 pos for LE and nitrites, Ucx 50-100K Ecoli - Received IV ceftriaxone , will transition to p.o. Bactrim twice daily for 3 days at discharge based on susceptibilities   Chest pain EKG nonischemic Trops negative -Appreciate cardiology consultation and recommendations -Continue aspirin , and statin -TSH normal -Echocardiogram reviewed with patient at bedside, no underlying cardiac or valvular abnormalities identified    Hypertension Well-controlled - Continue amlodipine , losartan    History of GERD - Continue PPI   ADDITIONAL NON-ACUTE FINDINGS, OBSERVATIONS, FAMILY DISCUSSIONS, ETC. (When present):   Gen: Lying supine in bed, reading a book (holding the book fairly close to face in order to see) Lungs: Respirations unlabored, no retractions or use of accessory muscles.  No audible cough or wheezing.  Good air movement bilaterally, saturating well on room air Heart: Regular rate and rhythm, no murmur Abdomen: Soft, nontender, nondistended, normal bowel sounds Extremities: No edema, no discoloration Skin: No jaundice or petechiae.   Neurologic: Alert and oriented x3, speech clear, no facial droop, no tremor, moves all extremities, no reported dizziness on discharge examination  Patient does continue to report blurred vision that she believes is worse than usual although she did not bring her current/new prescription to the hospital.  Discussed reassuring test results with patient.  Discussed importance of close outpatient follow-up with PCP and strict return precautions.  Discussed discharging home with insulin, and symptoms of hyper and hypoglycemia. ______________________________________  Timeline of Significant Events: [name]. Becknell is a 63 year old female with a history of type 2 diabetes mellitus, hypertension, obesity, and diverticulitis who was admitted on 07/09/2024 for evaluation and management of hyperglycemia, dizziness, and chest pain.  Hyperglycemia and Diabetes Management  She presented with symptomatic hyperglycemia (glucose >500 mg/dL at home, 612 mg/dL in ED) and poorly controlled diabetes (A1c 10.5%). Her hyperglycemia was attributed to recent prednisone use for a rash following antibiotic treatment for diverticulitis. She received insulin in the ED, and her glucose improved to 215-218 mg/dL during hospitalization. Metformin  was held during admission. She was managed with a  carb-controlled diet, correctional insulin, and frequent glucose monitoring.  Discussed that we will plan to discharge patient with correctional insulin with meals and that she should provide a log of blood sugars to PCP for further titration and possibly addition of long acting insulin nightly.  Also recommended continued dietary and lifestyle modifications to improve overall health.  Urinary Tract Infection  She had persistent urinary symptoms (frequency, incomplete bladder emptying, dysuria) and urinalysis showed small leukocyte esterase, rare bacteria, and urine culture positive for E. coli. Ceftriaxone  was administered during the hospitalization. Her temperature remained normal and infection symptoms improved.  She will be discharged to complete oral Bactrim for the next 3 days.  Dizziness, Gait Instability, and Weakness  She was admitted for intractable dizziness, unsteady gait, and blurry vision. Head CT and brain MRI were unrevealing for acute pathology. She received IV fluids and a trial of meclizine, with fall precautions in place. Physical therapy evaluation noted decreased strength, impaired balance, and gait instability, with recommendations for outpatient PT post-discharge. By 07/11/2024, she denied current dizziness, she is agreeable to outpatient PT, and will have as needed meclizine prescribed at discharge.  Chest Pain  She presented with chest pain, but EKG showed no ischemia and troponins were negative. Echocardiogram revealed normal left ventricular size and function (LVEF >55%). Cardiology consult reviewed prior low-risk cardiac PET/CT and event monitor studies, and determined her chest pain was non-cardiac in origin and resolved during admission. She was continued on aspirin   and advised to follow up with her outpatient cardiologist.  Hypertension  Her blood pressure was well-controlled during admission, ranging from 94/60 to 163/93. She continued her home antihypertensive  regimen.  Recent Diverticulitis and Rash  She had a recent episode of diverticulitis in July, treated with ciprofloxacin and metronidazole, followed by prednisone for a drug-induced rash. Diarrhea associated with diverticulitis had resolved prior to admission.  Functional Status and Discharge Planning  Physical therapy noted decreased strength, endurance, and mobility, with fall risk and impaired ADLs during hospitalization. She was independent at baseline and has 24/7 support at home. Discharge planning included recommendations for outpatient PT and use of her rolling walker at home. She was expected to discharge home.  Nutrition  She consumed approximately 50% of meals during admission and reported poor intake and weight loss prior to admission. BMI was 36.39 kg/m. She did not meet criteria for malnutrition but dietary and lifestyle modifications encouraged to improve overall health  Hospital Course  No hospital-acquired complications were noted. Pain was managed and resolved by discharge. No new skin or IV site issues occurred. She remained alert and oriented throughout hospitalization.  Follow-up  She will need outpatient follow-up with her primary care provider and cardiologist, and outpatient physical therapy was recommended.  Of note she already has a scheduled appointment with PCP on 8/20.   Mental Status On day of Discharge:  The patient is Alert and oriented to PERSON The patient is Alert And oriented to TIME The patient is Alert and oriented to LOCATION  CODE STATUS :                    Full Code   An advanced care planning discussion is not  had with patient and/or patient's decisions maker (documented separately).  Patient discharged on Home O2? - no Patient discharged on home anticoagulant? -  no  Foley Catheter status: None Central Line Status: NONE  Time Spent on Discharge I spent greater than 30 minutes counseling and coordinating care for the discharge of this  patient. The patient and I discussed the importance of outpatient follow-up as well as concerning signs and symptoms that would require immediate evaluation by a medical professional. The aforementioned conversation participants understand  and did show insight. I did use teachback to ensure understanding. The above participant/s is aware that not following the discussed plan, recommendations, and follow up can lead to severe negative effects on the patient's health, up to and including death.  Discharge Medications     Your Medication List     STOP taking these medications    hydroCHLOROthiazide  25 MG tablet Commonly known as: HYDRODIURIL    metroNIDAZOLE 250 MG tablet Commonly known as: FlagyL   metroNIDAZOLE 500 MG tablet Commonly known as: FLAGYL   nystatin 100,000 unit/gram cream Commonly known as: MYCOSTATIN   VITAMIN C 1,000 mg Tber Generic drug: ascorbic acid (vitamin C)   zinc 50 mg Tab       START taking these medications    blood-glucose meter kit Use as instructed   glucose blood test strip Generic drug: blood sugar diagnostic Use to check blood sugar as directed with insulin 3 times a day & for symptoms of high or low blood sugar.   insulin lispro 100 unit/mL injection pen Commonly known as: HumaLOG Inject 0-9 Units under the skin Three (3) times a day before meals. BG 150-169: 1 unit, BG 170-189: 2 units, BG 190-209: 3 units, BG 210-229: 4 units, BG 230-249: 5  units, BG 250-269: 6, units BG 270-289: 7 units, BG 290-300: 8 units BG > 300: 9 units   lancets Misc Use to check blood sugar as directed with insulin 3 times a day & for symptoms of high or low blood sugar.   meclizine 12.5 mg tablet Commonly known as: ANTIVERT Take 1 tablet (12.5 mg total) by mouth Three (3) times a day as needed.   meclizine 12.5 mg tablet Commonly known as: ANTIVERT Take 1 tablet (12.5 mg total) by mouth Three (3) times a day as needed (Dizziness).   pen needle,  diabetic 32 gauge x 5/32 (4 mm) Ndle Use with insulin up to 4 times/day as needed.   promethazine  25 MG tablet Commonly known as: PHENERGAN  Take 1 tablet (25 mg total) by mouth every six (6) hours as needed for nausea for up to 7 days.   sulfamethoxazole-trimethoprim 400-80 mg per tablet Commonly known as: BACTRIM Take 2 tablets (160 mg of trimethoprim total) by mouth two (2) times a day for 3 days.       CONTINUE taking these medications    amlodipine  5 MG tablet Commonly known as: NORVASC  Take 1 tablet (5 mg total) by mouth in the morning.   losartan  25 MG tablet Commonly known as: COZAAR  Take 1 tablet (25 mg total) by mouth in the morning.   metFORMIN  500 MG 24 hr tablet Commonly known as: GLUCOPHAGE -XR Take 1 tablet (500 mg total) by mouth daily with evening meal.   omeprazole 20 MG capsule Commonly known as: PriLOSEC Take 1 capsule (20 mg total) by mouth. Pt reports taking it every other day instead of daily   OZEMPIC 0.25 mg or 0.5 mg(2 mg/1.5 mL) Pnij injection Generic drug: semaglutide Inject under the skin every seven (7) days. Uninown dosage       ASK your doctor about these medications    ciprofloxacin HCl 500 MG tablet Commonly known as: CIPRO Take 1 tablet (500 mg total) by mouth two (2) times a day for 10 days. Ask about: Should I take this medication?   ondansetron  4 MG disintegrating tablet Commonly known as: ZOFRAN -ODT Take 1 tablet (4 mg total) by mouth every eight (8) hours as needed for nausea for up to 7 days. Ask about: Should I take this medication?   rosuvastatin 5 MG tablet Commonly known as: CRESTOR Take 1 tablet (5 mg total) by mouth. Every other day       _____________________________________  Nutrition:                                  Diet Instructions     Discharge diet (specify)     Discharge Nutrition Therapy: Heart Healthy       Patient does not meet AND/ASPEN criteria for malnutrition at this time (07/11/24  1045)             ___________________________________________  Discharge Instructions   Nutrition:                                  Diet Instructions     Discharge diet (specify)     Discharge Nutrition Therapy: Heart Healthy       Activity:  Activity Instructions     Activity as tolerated         Appointments:                          Follow Up:                              Follow Up instructions and Outpatient Referrals    Discharge instructions         Allergies  Allergen Reactions  . Oxycodone Nausea And Vomiting     Past Medical History[1]  Past Surgical History[2]   Family History[3]   Current Medications[4]  Imaging  Echocardiogram W Colorflow Spectral Doppler Result Date: 07/10/2024 Patient Info Name:     Donna Casey Age:     62 years DOB:     [birth year] Gender:     Female MRN:     [id number] Accession #:     [id number] [facility] Account #:     [id number] Ht:     175 cm Wt:     112 kg BSA:     2.38 m2 BP:     120 /     79 mmHg HR:     95 bpm Exam Date:     07/10/2024 12:17 PM Admit Date:     07/09/2024 Exam Type:     ECHOCARDIOGRAM W COLORFLOW SPECTRAL DOPPLER Technical Quality:     Fair Staff Sonographer:     Mliss Gate Supervising Physician:     Marinell Fairy How MD Ordering Physician:     Thersia Roger Study Info Indications      - Chest pain,  dizziness Procedure(s)   Complete two-dimensional, color flow and Doppler transthoracic echocardiogram is performed. Summary   1. The left ventricle is normal in size with normal wall thickness.   2. The left ventricular systolic function is normal, LVEF is visually estimated at > 55%.   3. The right ventricle is normal in size, with normal systolic function. Left Ventricle   The left ventricle is normal in size with normal wall thickness. The left ventricular systolic function is normal, LVEF is visually estimated at > 55%. There is normal left ventricular diastolic  function. Right Ventricle   The right ventricle is normal in size, with normal systolic function. Left Atrium   The left atrium is normal in size. Right Atrium   The right atrium is not well visualized but probably normal in size. Aortic Valve   The aortic valve is trileaflet with normal appearing leaflets with normal excursion. There is no significant aortic regurgitation. There is no evidence of a significant transvalvular gradient. Mitral Valve   The mitral valve leaflets are normal with normal leaflet mobility. There is no significant mitral valve regurgitation. Tricuspid Valve   The tricuspid valve leaflets are normal, with normal leaflet mobility. There is no significant tricuspid regurgitation. The pulmonary systolic pressure cannot be estimated due to insufficient TR signal. Pulmonic Valve   Pulmonary valve is not well visualized. There is no significant pulmonic regurgitation. There is no evidence of a significant transvalvular gradient. Aorta   The aorta is normal in size in the visualized segments. Inferior Vena Cava   The IVC is not well visualized precluding the ability to accurate assess right atrial pressure. Pericardium/Pleural   There is no pericardial effusion. Ventricles ---------------------------------------------------------------------- Name  Value        Normal ---------------------------------------------------------------------- LV Dimensions 2D/MM ----------------------------------------------------------------------  IVS Diastolic Thickness (2D)                                1.0 cm       0.6-0.9 LVID Diastole (2D)                  3.7 cm       3.8-5.2  LVPW Diastolic Thickness (2D)                                0.9 cm       0.6-0.9 LVID Systole (2D)                   2.4 cm       2.2-3.5 LVOT Diameter                       1.5 cm               LV Mass Index (2D Cubed)           44 g/m2         43-95  Relative Wall Thickness (2D)                                   0.49        <=0.42 LV Function ---------------------------------------------------------------------- LV EF (4C MOD)                        60 %                LV Diastolic Volume Index (BP MOD)                        31.4 ml/m2     29.0-61.0 LV EF (BP MOD)                        63 %         54-74 RV Dimensions 2D/MM ----------------------------------------------------------------------  RV Basal Diastolic Dimension                           3.2 cm       2.5-4.1 TAPSE                               2.8 cm         >=1.7 Atria ---------------------------------------------------------------------- Name                                 Value        Normal ---------------------------------------------------------------------- LA Dimensions ---------------------------------------------------------------------- LA Dimension (2D)                   2.9 cm       2.7-3.8 LA Volume Index (4C A-L)        22.49 ml/m2               LA Volume Index (2C A-L)  10.44 ml/m2               LA Volume (BP MOD)                   39 ml               LA Volume Index (BP MOD)        16.24 ml/m2   16.00-34.00 RA Dimensions ---------------------------------------------------------------------- RA Area (4C)                      12.9 cm2        <=18.0 RA Area (4C) Index              5.4 cm2/m2               RA ESV Index (4C MOD)             12 ml/m2         15-27 Left Ventricular Outflow Tract ---------------------------------------------------------------------- Name                                 Value        Normal ---------------------------------------------------------------------- LVOT 2D ---------------------------------------------------------------------- LVOT Diameter                       1.5 cm               LVOT Area                          1.8 cm2               LVOT Doppler ---------------------------------------------------------------------- LVOT Peak Velocity                 0.8 m/s               LVOT VTI                              16 cm               LVOT Stroke Volume                   28 ml               LVOT SI                           12 ml/m2 Aortic Valve ---------------------------------------------------------------------- Name                                 Value        Normal ---------------------------------------------------------------------- AV Doppler ---------------------------------------------------------------------- AV Peak Velocity                   1.1 m/s               AV Peak Gradient                    5 mmHg               AV Mean Gradient  3 mmHg               AV VTI                               18 cm               AV Area (Cont Eq VTI)              1.5 cm2         >=3.0 AV Area Index (Cont Eq VTI)     0.6 cm2/m2               AV Area (Cont Eq Vel)              1.2 cm2               AV Area Index (Cont Eq Vel)     0.5 cm2/m2               AV DI (Vel)                           0.68               AV DI (VTI)                           0.85 Mitral Valve ---------------------------------------------------------------------- Name                                 Value        Normal ---------------------------------------------------------------------- MV Diastolic Function ---------------------------------------------------------------------- MV E Peak Velocity                 59 cm/s               MV A Peak Velocity                 87 cm/s               MV E/A                                 0.7               MV Annular TDI ---------------------------------------------------------------------- MV Septal e' Velocity             8.5 cm/s         >=8.0 MV E/e' (Septal)                       6.9               MV Lateral e' Velocity            7.9 cm/s        >=10.0 MV E/e' (Lateral)                      7.4               MV e' Average                     8.2 cm/s               MV E/e' (  Average)                      7.2 Aorta  ---------------------------------------------------------------------- Name                                 Value        Normal ---------------------------------------------------------------------- Ascending Aorta ---------------------------------------------------------------------- Ao Root Diameter (2D)               2.7 cm               Ao Root Diam Index (2D)          1.1 cm/m2               Ascending Aorta Diameter            2.6 cm Report Signatures Finalized by Marinell Fairy How  MD on 07/10/2024 06:37 PM  CT Lumbar Spine Wo Contrast Result Date: 07/10/2024 Exam:  CT Lumbar Spine without Contrast  History:  Chronic low back pain  Technique: Routine lumbar spine CT without IV contrast. AEC (automated exposure control) and/or manual techniques such as size-specific kV and mAs are employed where appropriate to reduce radiation exposure for all CT exams.  Comparison:  CT abdomen/pelvis 06/10/2024.  Findings: Vertebral bodies are normally aligned. Vertebral body heights are grossly preserved. Mild to moderate multilevel degenerative disc disease most notable at L5-S1.  L1-2:  No significant canal stenosis or neural foraminal narrowing.  L2-3:  No significant canal stenosis or neural foraminal narrowing.  L3-4:  Small symmetric diffuse disc bulge impresses on the ventral thecal sac. Mild ligamentum flavum hypertrophy and facet arthrosis. Combination of degenerative findings contribute to mild bilateral neural foraminal narrowing without canal stenosis.  L4-5:  Small symmetric diffuse disc bulge impresses on the ventral thecal sac. Mild ligamentum flavum hypertrophy and facet arthrosis. Combination of degenerative findings contribute to mild canal stenosis (9.0 mm) without significant neural foraminal narrowing.  L5-S1:  Small symmetric diffuse disc bulge impresses on the ventral thecal sac. Mild ligamentum flavum hypertrophy and facet arthrosis. Combination of degenerative findings contribute to mild canal  stenosis (9.8 mm) with mild bilateral neural foraminal narrowing.    1.    Mild to moderate multilevel degenerative disc disease most notable at L5-S1. 2.    Mild canal stenosis at L4-5 and L5-S1. 3.    Mild bilateral neural foraminal narrowing at L3-4 and L5-S1.  Signed (Electronic Signature): 07/10/2024 4:14 PM Signed By: Worth Pih, MD  MRI Brain Wo Contrast Result Date: 07/10/2024 Exam:  MRI Brain without Contrast  History:  Altered mental status; new focal neurologic deficit.  Technique:  Complete brain MRI without IV contrast.  Comparison:  None.  Findings: No evidence of an acute ischemic infarction, hemorrhage, edema, mass, or mass effect. A few scattered supratentorial white matter T2 and FLAIR hyperintensities. No other abnormal signal intensity throughout the brain parenchyma.  Ventricles and basilar cisterns unremarkable. Orbits and sellar/suprasellar region are without focal abnormality. Major intracranial vascular flow voids are patent. Calvarium, midline structures, and imaged extra cranial soft tissues unremarkable. Sinuses and mastoid air cells are clear.    1.  No acute pathology. 2.  A few chronic white matter T2/FLAIR hyperintensities likely sequela of small vessel ischemia or migraine headaches.  Signed (Electronic Signature): 07/10/2024 12:37 PM Signed By: Oneil Squire, MD  CT Head Wo Contrast Result Date: 07/09/2024 Exam:  CT Head without Contrast  History:  Dizziness  Technique: Routine brain CT without IV contrast. AEC (automated exposure control) and/or manual techniques such as size-specific kV and mAs are employed where appropriate to reduce radiation exposure for all CT exams.  Comparison:  01/13/2022  Findings:   BRAIN:  No CT evidence of acute infarction, hemorrhage, edema, mass or mass effect. Mild hypodensity within periventricular white matter is seen suggesting small vessel ischemic change. Prominence of ventricles and sulci noted. Basal cisterns are patent.  SOFT TISSUES:   Atherosclerotic calcifications are seen within the carotid siphons. CALVARIUM:  Negative. No fracture. SINUSES AND MASTOIDS:  No mucosal thickening or fluid.    No acute intracranial process.  Signed (Electronic Signature): 07/09/2024 9:34 PM Signed By: Leita Poisson, MD  XR Chest Portable Result Date: 07/09/2024 Exam:  Portable Chest  History:  Chest pain  Technique:  Single frontal view.  Comparison:  11/06/2018  Findings:   Cardiomediastinal contours are unchanged. There is no focal consolidation. No pneumothorax or pleural effusion. Degenerative changes in the thoracic spine.    No acute cardiopulmonary process.  Signed (Electronic Signature): 07/09/2024 9:10 PM Signed By: Leita Poisson, MD   Lab Results   Recent Labs    07/11/24 0529  WBC 6.7  HGB 12.5  HCT 37.2  PLT 316   Recent Labs    07/09/24 2050 07/10/24 0423 07/11/24 0529  NA 134* 134* 137  K 3.9 3.3* 3.9  CL 99 101 102  CO2 23.5 27.4 24.9  BUN 15 12 9   CREATININE 0.96 0.72 0.73  GLU 387* 215* 249*  CALCIUM 8.7 8.8 8.3*  ALBUMIN 3.6  --   --   PROT 6.9  --   --   BILITOT 0.3  --   --   AST 13*  --   --   ALT 24  --   --   ALKPHOS 159*  --   --   TSH  --  2.298  --    Recent Labs    07/09/24 2050 07/10/24 1058 07/10/24 1315  TROPONINI 4   < > 4  INR 0.98  --   --    < > = values in this interval not displayed.   Recent Labs    07/09/24 2144  WBCUA 6*  NITRITE Negative  LEUKOCYTESUR Small*  BACTERIA Rare*  RBCUA 4*  BLOODU Negative  GLUCOSEU >1000 mg/dL*  PROTEINUA Negative  KETONESU Negative   Recent Labs    07/09/24 2141  OPIAU Negative  BENZU Negative  AMPHU Negative  COCAU Negative  CANNAU Negative  BARBU Negative   No results for input(s): PREGTESTUR, PREGPOC in the last 72 hours. Recent Labs    07/10/24 0423  A1C 10.5*  CHOL 119  LDL 48  HDL 59  TRIG 90   No results for input(s): O2SOUR, FIO2ART, PHART, PCO2ART, PO2ART, HCO3ART, O2SATART, BEART in the last  72 hours. Pending Labs     Order Current Status   Urine Culture In process       Home Medications   Prior to Admission medications  Medication Dose, Route, Frequency  amLODIPine  (NORVASC ) 5 MG tablet 5 mg, Daily (standard)  hydroCHLOROthiazide  (HYDRODIURIL ) 25 MG tablet 25 mg, Daily (standard)  losartan  (COZAAR ) 25 MG tablet 25 mg, Daily (standard)  metFORMIN  (GLUCOPHAGE -XR) 500 MG 24 hr tablet 500 mg, Daily  omeprazole (PRILOSEC) 20 MG capsule 20 mg  semaglutide (OZEMPIC) 0.25 mg or 0.5 mg(2 mg/1.5 mL) PnIj injection Every 7 days  ascorbic acid, vitamin C, (  VITAMIN C) 1,000 mg TbER 1,000 mg, Oral, 2 times a day Patient not taking: Reported on 07/09/2024  blood sugar diagnostic (GLUCOSE BLOOD) Strp Use to check blood sugar as directed with insulin 3 times a day & for symptoms of high or low blood sugar.  blood-glucose meter kit Use as instructed  insulin lispro (HUMALOG) 100 unit/mL injection pen 0-9 Units, Subcutaneous, 3 times a day (AC), BG 150-169: 1 unit, BG 170-189: 2 units, BG 190-209: 3 units, BG 210-229: 4 units, BG 230-249: 5 units, BG 250-269: 6, units BG 270-289: 7 units, BG 290-300: 8 units BG > 300: 9 units  lancets Misc Use to check blood sugar as directed with insulin 3 times a day & for symptoms of high or low blood sugar.  meclizine (ANTIVERT) 12.5 mg tablet 12.5 mg, Oral, 3 times a day PRN  meclizine (ANTIVERT) 12.5 mg tablet 12.5 mg, Oral, 3 times a day PRN  metroNIDAZOLE (FLAGYL) 250 MG tablet 500 mg, Oral, 3 times a day (standard) Patient not taking: Reported on 07/09/2024  nystatin (MYCOSTATIN) 100,000 unit/gram cream Topical, 2 times a day (standard) Patient not taking: Reported on 07/09/2024  pen needle, diabetic 32 gauge x 5/32 (4 mm) Ndle Use with insulin up to 4 times/day as needed.  promethazine  (PHENERGAN ) 25 MG tablet 25 mg, Oral, Every 6 hours PRN  rosuvastatin (CRESTOR) 5 MG tablet 5 mg Patient not taking: Reported on 07/09/2024   sulfamethoxazole-trimethoprim (BACTRIM) 400-80 mg per tablet 2 tablets, Oral, 2 times a day (standard)  zinc 50 mg Tab 50 mg, Oral, Daily before breakfast Patient not taking: Reported on 07/09/2024   Margart Dragon, DO Hospitalist, [facility] 07/11/24, 12:07 PM      [1] Past Medical History: Diagnosis Date  . Diabetes mellitus      . Diverticulitis of colon   . Hypertension   [2] Past Surgical History: Procedure Laterality Date  . cervial fusion    . COLONOSCOPY  12/29/2018   severe diverticulosis  . ENDOMETRIAL ABLATION N/A   . JOINT REPLACEMENT     L knee, R hip, and R shoulder  . MOUTH SURGERY     3rd molar removed  . REVISION OF TOTAL SHOULDER    . SPINE SURGERY    . TUBAL LIGATION    [3] Family History Problem Relation Age of Onset  . Cancer Mother   . Heart disease Mother   [4]  Current Facility-Administered Medications:  .  acetaminophen  (TYLENOL ) tablet 650 mg, 650 mg, Oral, Q4H PRN, Hugelmeyer, Alexis, DO, 650 mg at 07/10/24 1056 .  acetaminophen  (TYLENOL ) tablet 650 mg, 650 mg, Oral, Q4H PRN, Hugelmeyer, Alexis, DO, 650 mg at 07/11/24 1203 .  albuterol 2.5 mg /3 mL (0.083 %) nebulizer solution 2.5 mg, 2.5 mg, Nebulization, Q6H PRN, Hugelmeyer, Alexis, DO .  aluminum-magnesium  hydroxide-simethicone (MAALOX MAX) 80-80-8 mg/mL oral suspension, 30 mL, Oral, Q4H PRN, Hugelmeyer, Alexis, DO .  amlodipine  (NORVASC ) tablet 5 mg, 5 mg, Oral, Daily, Hugelmeyer, Alexis, DO, 5 mg at 07/11/24 0819 .  aspirin  chewable tablet 81 mg, 81 mg, Oral, Daily, Baveja, Punit, MD, 81 mg at 07/11/24 0819 .  cefTRIAXone  (ROCEPHIN ) IVPB 1 g in 50 mL dextrose  (premix), 1 g, Intravenous, Q24H, Hugelmeyer, Alexis, DO, Stopped at 07/10/24 2159 .  dextrose  (GLUTOSE) 40 % gel 15 g of dextrose , 15 g of dextrose , Oral, Q10 Min PRN, Hugelmeyer, Alexis, DO .  dextrose  50 % in water (D50W) 50 % solution 12.5 g, 12.5 g, Intravenous, Q15 Min  PRN, Hugelmeyer, Alexis, DO .  enoxaparin  (LOVENOX ) syringe  40 mg, 40 mg, Subcutaneous, Q24H, Hugelmeyer, Alexis, DO, 40 mg at 07/11/24 0818 .  glucagon injection 1 mg, 1 mg, Intramuscular, Once PRN, Hugelmeyer, Alexis, DO .  guaiFENesin (ROBITUSSIN) oral syrup, 200 mg, Oral, Q4H PRN, Hugelmeyer, Alexis, DO .  insulin lispro (HumaLOG) injection CORRECTIONAL 0-20 Units, 0-20 Units, Subcutaneous, ACHS, Hugelmeyer, Alexis, DO, 9 Units at 07/11/24 1202 .  losartan  (COZAAR ) tablet 25 mg, 25 mg, Oral, Daily, Hugelmeyer, Alexis, DO, 25 mg at 07/11/24 0819 .  meclizine (ANTIVERT) tablet 12.5 mg, 12.5 mg, Oral, TID PRN, Hugelmeyer, Alexis, DO .  melatonin tablet 3 mg, 3 mg, Oral, Nightly PRN, Hugelmeyer, Alexis, DO .  nitroglycerin (NITROSTAT) SL tablet 0.4 mg, 0.4 mg, Sublingual, Q5 Min PRN, Hugelmeyer, Alexis, DO .  ondansetron  (ZOFRAN ) injection 4 mg, 4 mg, Intravenous, Q8H PRN **OR** ondansetron  (ZOFRAN ) injection 8 mg, 8 mg, Intravenous, Q8H PRN, Hugelmeyer, Alexis, DO .  pantoprazole  (Protonix ) EC tablet 40 mg, 40 mg, Oral, Daily before breakfast, Hugelmeyer, Alexis, DO, 40 mg at 07/11/24 0749 .  polyethylene glycol (MIRALAX ) packet 17 g, 17 g, Oral, Daily PRN, Hugelmeyer, Alexis, DO, 17 g at 07/11/24 0839 .  senna-docusate (PERICOLACE) 8.6-50 mg 1 tablet, 1 tablet, Oral, Nightly, Hugelmeyer, Alexis, DO, 1 tablet at 07/09/24 2330

## 2024-07-12 ENCOUNTER — Telehealth: Payer: Self-pay | Admitting: *Deleted

## 2024-07-12 NOTE — Transitions of Care (Post Inpatient/ED Visit) (Signed)
   07/12/2024  Name: BRINN WESTBY MRN: [id number] DOB: 1961-05-26  Today's TOC FU Call Status: Today's TOC FU Call Status:: Unsuccessful Call (1st Attempt) Unsuccessful Call (1st Attempt) Date: 07/12/24  Attempted to reach the patient regarding the most recent Inpatient/ED visit.  Follow Up Plan: Additional outreach attempts will be made to reach the patient to complete the Transitions of Care (Post Inpatient/ED visit) call.  Cathlean Headland BSN RN [facility] Health Care Management Coordinator [email or web address] Direct Dial: [phone]  Fax: [phone] Website: [email or web address]

## 2024-07-13 ENCOUNTER — Telehealth: Payer: Self-pay

## 2024-07-13 NOTE — Transitions of Care (Post Inpatient/ED Visit) (Signed)
   07/13/2024  Name: Donna Casey MRN: [id number] DOB: 06-Jul-1961  Today's TOC FU Call Status: Today's TOC FU Call Status:: Unsuccessful Call (2nd Attempt) Unsuccessful Call (2nd Attempt) Date: 07/13/24  Attempted to reach the patient regarding the most recent Inpatient/ED visit.  Follow Up Plan: Additional outreach attempts will be made to reach the patient to complete the Transitions of Care (Post Inpatient/ED visit) call.   Shona Prow RN, CCM [place]  VBCI-Population Health RN Care Manager [phone]

## 2024-07-13 NOTE — Transitions of Care (Post Inpatient/ED Visit) (Signed)
 07/13/2024  Name: Donna Casey MRN: [id number] DOB: 03-Jul-1961  Today's TOC FU Call Status: Today's TOC FU Call Status:: Successful TOC FU Call Completed TOC FU Call Complete Date: 07/13/24 Patient's Name and Date of Birth confirmed.  Transition Care Management Follow-up Telephone Call How have you been since you were released from the hospital?: Better Any questions or concerns?: No  Items Reviewed: Did you receive and understand the discharge instructions provided?: Yes Medications obtained,verified, and reconciled?: Yes (Medications Reviewed) Any new allergies since your discharge?: No (Patient unsure if she is not to take Prednisone due to sugar increase or allergy) Dietary orders reviewed?: Yes Type of Diet Ordered:: low carb diabetic diet Do you have support at home?: Yes People in Home [RPT]: child(ren), adult, spouse Name of Support/Comfort Primary Source: husband and 3 daughters help as needed  Medications Reviewed Today: Medications Reviewed Today     Reviewed by Lauro Shona LABOR, RN (Registered Nurse) on 07/13/24 at 1634  Med List Status: <None>   Medication Order Taking? Sig Documenting Provider Last Dose Status Informant  amLODipine  (NORVASC ) 5 MG tablet [id number] Yes Take 5 mg by mouth daily. [provider]  Active Self           Med Note JERALYN, FLORIDA A   Fri Jul 15, 2018  3:22 PM)    blood glucose meter kit and supplies [id number] Yes 1 each by Other route as directed. Check 3x/day and as needed for symptoms of high/low blood sugar [provider]  Active   hydrochlorothiazide  (HYDRODIURIL ) 25 MG tablet [id number]  Take 25 mg by mouth daily.  Patient not taking: Reported on 07/13/2024   [provider]  Active   insulin lispro (HUMALOG) 100 UNIT/ML injection [id number] Yes Inject 2-10 Units into the skin 3 (three) times daily before meals.  Patient taking differently: Inject 2-10 Units into the skin 3 (three) times daily before  meals. Patient states Dr Atilano changed her dose 07/11/24 after her discharge to states Dr Atilano changed ss to BG 150-199 2 units; 200-249 4 units; 250-299 6 units; 300-349 8 units and 350-400 10 units - Call MD if >400   [provider]  Active   losartan  (COZAAR ) 25 MG tablet [id number] Yes Take 25 mg by mouth daily. [provider]  Active Self  metFORMIN  (GLUCOPHAGE -XR) 500 MG 24 hr tablet [id number] Yes Take 500 mg by mouth daily. [provider]  Active Self  nitroGLYCERIN (NITROSTAT) 0.4 MG SL tablet [id number]  Place 0.4 mg under the tongue every 5 (five) minutes as needed for chest pain.  Patient not taking: Reported on 07/13/2024   [provider]  Active   omeprazole (PRILOSEC) 20 MG capsule [id number] Yes Take 20 mg by mouth daily. [provider]  Active Self           Med Note JERALYN, FLORIDA A   Fri Jul 15, 2018  3:22 PM)    rosuvastatin (CRESTOR) 5 MG tablet [id number] Yes Take 5 mg by mouth every other day. [provider]  Active   Semaglutide,0.25 or 0.5MG /DOS, (OZEMPIC, 0.25 OR 0.5 MG/DOSE,) 2 MG/3ML SOPN [id number] Yes Inject 0.5 mg into the skin once a week. [provider]  Active             Home Care and Equipment/Supplies: Were Home Health Services Ordered?: No Any new equipment or medical supplies ordered?: Yes Name of Medical supply agency?: diabetic testing kit - picked up from [facility] Were you  able to get the equipment/medical supplies?: Yes Do you have any questions related to the use of the equipment/supplies?: No (TOC RN reviewed supplies - Accu Check meter)  Functional Questionnaire: Do you need assistance with bathing/showering or dressing?: No Do you need assistance with meal preparation?: No Do you need assistance with eating?: No Do you have difficulty maintaining continence: Yes (patient reports urinary incontinence) Do you need assistance with getting out of bed/getting out of a chair/moving?:  No Do you have difficulty managing or taking your medications?: No  Follow up appointments reviewed: PCP Follow-up appointment confirmed?: Yes Date of PCP follow-up appointment?: 07/19/24 Follow-up Provider: PCP: Atil[facility] Follow-up appointment confirmed?: Yes Date of Specialist follow-up appointment?: 08/21/24 Follow-Up Specialty Provider:: cardio, Olivia Pavy - Review ECHO Do you need transportation to your follow-up appointment?: No (patient drives) Do you understand care options if your condition(s) worsen?: Yes-patient verbalized understanding  SDOH Interventions Today    Flowsheet Row Most Recent Value  SDOH Interventions   Food Insecurity Interventions Intervention Not Indicated  Housing Interventions Intervention Not Indicated  Transportation Interventions Intervention Not Indicated  Utilities Interventions Intervention Not Indicated    Goals Addressed             This Visit's Progress    VBCI Transitions of Care (TOC) Care Plan       Problems:  Recent Hospitalization for treatment of Vertigo/hyperglycemia Patient home with new diabetic kit and states pharmacy did not have time to help - called her PCP who walked her through - [facility] RN walked through instructions again and had patient check during call - patient reported random sugar was 197 - TOC RN and patient walked through sliding scale - TOC RN also educated to make sure family members know how to check her sugar and how to respond to low/high sugar readings and discuss glucagon with PCP  Call lasted more than an hour - TOC RN unable to review all assessments due to time - Will review with future call  Goal:  Over the next 30 days, the patient will not experience hospital readmission  Interventions:  Transitions of Care: Doctor Visits  - discussed the importance of doctor visits  Diabetes Interventions: Assessed patient's understanding of A1c goal: <7% Provided education to patient about  basic DM disease process Reviewed medications with patient and discussed importance of medication adherence Assessed social determinant of health barriers Lab Results  Component Value Date   HGBA1C 6.5 (H) 07/22/2018  Patient reports A1C in hospital at [facility] was 10.5 and confirmed with chart  Hypertension Interventions: Last practice recorded BP readings:  BP Readings from Last 3 Encounters:  01/05/24 136/84  12/28/23 112/70  11/26/23 (!) 138/92   Most recent eGFR/CrCl: No results found for: EGFR  No components found for: CRCL  Evaluation of current treatment plan related to hypertension self management and patient's adherence to plan as established by provider Provided education to patient re: stroke prevention, s/s of heart attack and stroke Reviewed medications with patient and discussed importance of compliance Discussed plans with patient for ongoing care management follow up and provided patient with direct contact information for care management team Advised patient, providing education and rationale, to monitor blood pressure daily and record, calling PCP for findings outside established parameters  Patient Self Care Activities:  Attend all scheduled provider appointments Call pharmacy for medication refills 3-7 days in advance of running out of medications Call provider office for new concerns or questions  Notify RN Care  Manager of TOC call rescheduling needs Participate in Transition of Care Program/Attend TOC scheduled calls Take medications as prescribed   check blood sugar at prescribed times: three times daily check feet daily for cuts, sores or redness enter blood sugar readings and medication or insulin into daily log take the blood sugar log to all doctor visits manage portion size write blood pressure results in a log or diary take blood pressure log to all doctor appointments keep all doctor appointments  Plan:  Patient states PCP appt is 07/19/24 after  labs 07/18/24 and has appt with cardio, Olivia Pavy - Review ECHO 9/22 Telephone follow up appointment with care management team member scheduled for:  07/20/24 10am The patient has been provided with contact information for the care management team and has been advised to call with any health related questions or concerns.          Shona Prow RN, CCM [place]  VBCI-Population Health RN Care Manager [phone]

## 2024-07-13 NOTE — Transitions of Care (Post Inpatient/ED Visit) (Signed)
   07/13/2024  Name: Donna Casey MRN: [id number] DOB: [birth year]  Today's TOC FU Call Status: Today's TOC FU Call Status:: Unsuccessful Call (3rd Attempt) Unsuccessful Call (3rd Attempt) Date: 07/13/24  Attempted to reach the patient regarding the most recent Inpatient/ED visit.  Follow Up Plan: No further outreach attempts will be made at this time. We have been unable to contact the patient. IF patient returns call again and leaves another voice message - [facility] RN will make another attempt.  Shona Prow RN, CCM [place]  VBCI-Population Health RN Care Manager [phone]

## 2024-07-13 NOTE — Patient Instructions (Signed)
 Visit Information  Thank you for taking time to visit with me today. Please don't hesitate to contact me if I can be of assistance to you before our next scheduled telephone appointment.  Our next appointment is by telephone on 07/20/24 at 10am  Following is a copy of your care plan:   Goals Addressed             This Visit's Progress    VBCI Transitions of Care (TOC) Care Plan       Problems:  Recent Hospitalization for treatment of Vertigo/hyperglycemia Patient home with new diabetic kit and states pharmacy did not have time to help - called her PCP who walked her through - [facility] RN walked through instructions again and had patient check during call - patient reported random sugar was 197 - TOC RN and patient walked through sliding scale - TOC RN also educated to make sure family members know how to check her sugar and how to respond to low/high sugar readings and discuss glucagon with PCP  Call lasted more than an hour - TOC RN unable to review all assessments due to time - Will review with future call  Goal:  Over the next 30 days, the patient will not experience hospital readmission  Interventions:  Transitions of Care: Doctor Visits  - discussed the importance of doctor visits  Diabetes Interventions: Assessed patient's understanding of A1c goal: <7% Provided education to patient about basic DM disease process Reviewed medications with patient and discussed importance of medication adherence Assessed social determinant of health barriers Lab Results  Component Value Date   HGBA1C 6.5 (H) 07/22/2018  Patient reports A1C in hospital at [facility] was 10.5 and confirmed with chart  Hypertension Interventions: Last practice recorded BP readings:  BP Readings from Last 3 Encounters:  01/05/24 136/84  12/28/23 112/70  11/26/23 (!) 138/92   Most recent eGFR/CrCl: No results found for: EGFR  No components found for: CRCL  Evaluation of current treatment plan related to  hypertension self management and patient's adherence to plan as established by provider Provided education to patient re: stroke prevention, s/s of heart attack and stroke Reviewed medications with patient and discussed importance of compliance Discussed plans with patient for ongoing care management follow up and provided patient with direct contact information for care management team Advised patient, providing education and rationale, to monitor blood pressure daily and record, calling PCP for findings outside established parameters  Patient Self Care Activities:  Attend all scheduled provider appointments Call pharmacy for medication refills 3-7 days in advance of running out of medications Call provider office for new concerns or questions  Notify RN Care Manager of TOC call rescheduling needs Participate in Transition of Care Program/Attend TOC scheduled calls Take medications as prescribed   check blood sugar at prescribed times: three times daily check feet daily for cuts, sores or redness enter blood sugar readings and medication or insulin into daily log take the blood sugar log to all doctor visits manage portion size write blood pressure results in a log or diary take blood pressure log to all doctor appointments keep all doctor appointments  Plan:  Patient states PCP appt is 07/19/24 after labs 07/18/24 and has appt with cardio, Olivia Pavy - Review ECHO 9/22 Telephone follow up appointment with care management team member scheduled for:  07/20/24 10am The patient has been provided with contact information for the care management team and has been advised to call with any health related questions or concerns.  Patient verbalizes understanding of instructions and care plan provided today and agrees to view in MyChart. Active MyChart status and patient understanding of how to access instructions and care plan via MyChart confirmed with patient.     Telephone follow  up appointment with care management team member scheduled for: 07/20/24 10am The patient has been provided with contact information for the care management team and has been advised to call with any health related questions or concerns.   Please call the care guide team at [phone] if you need to cancel or reschedule your appointment.   Please call the Suicide and Crisis Lifeline: 988 call 1-800-273-TALK (toll free, 24 hour hotline) call 911 if you are experiencing a Mental Health or Behavioral Health Crisis or need someone to talk to.  Shona Prow RN, CCM   VBCI-Population Health RN Care Manager [phone]

## 2024-07-18 DIAGNOSIS — R269 Unspecified abnormalities of gait and mobility: Secondary | ICD-10-CM | POA: Diagnosis not present

## 2024-07-18 DIAGNOSIS — E7849 Other hyperlipidemia: Secondary | ICD-10-CM | POA: Diagnosis not present

## 2024-07-18 DIAGNOSIS — E1122 Type 2 diabetes mellitus with diabetic chronic kidney disease: Secondary | ICD-10-CM | POA: Diagnosis not present

## 2024-07-18 DIAGNOSIS — M6281 Muscle weakness (generalized): Secondary | ICD-10-CM | POA: Diagnosis not present

## 2024-07-18 DIAGNOSIS — N1831 Chronic kidney disease, stage 3a: Secondary | ICD-10-CM | POA: Diagnosis not present

## 2024-07-18 DIAGNOSIS — E119 Type 2 diabetes mellitus without complications: Secondary | ICD-10-CM | POA: Diagnosis not present

## 2024-07-19 DIAGNOSIS — R300 Dysuria: Secondary | ICD-10-CM | POA: Diagnosis not present

## 2024-07-20 ENCOUNTER — Other Ambulatory Visit: Payer: Self-pay

## 2024-07-20 NOTE — Transitions of Care (Post Inpatient/ED Visit) (Signed)
 Transition of Care week 2  Visit Note  07/20/2024  Name: Donna Casey MRN: [id number]          DOB: 03-17-1961  Situation: Patient enrolled in [facility] 30-day program. Visit completed with patient by telephone.   Background: Admit/Discharge Date  8/10 - 8/12 [facility] Primary Diagnosis: Vertigo/hyperglycemia  Initial Transition Care Management Follow-up Telephone Call    Past Medical History:  Diagnosis Date   Anemia    Arthritis    Diabetes mellitus without complication (HCC)    Enlarged liver    Family history of adverse reaction to anesthesia    N/V   Fatty liver    GERD (gastroesophageal reflux disease)    H pylori ulcer    Heart palpitations    Hypertension    Numbness and tingling in hands    right hand   PONV (postoperative nausea and vomiting)    Primary localized osteoarthritis of left knee 08/02/2018    Assessment: Patient Reported Symptoms: Cognitive Cognitive Status: No symptoms reported, Normal speech and language skills, Alert and oriented to person, place, and time      Neurological Neurological Review of Symptoms: No symptoms reported    HEENT HEENT Symptoms Reported: No symptoms reported      Cardiovascular Cardiovascular Symptoms Reported: Swelling in legs or feet Patient's Recent BP reading at home: patient states she still has right leg/knee swelling but states it has improved and is more related to knee issue Cardiovascular Comment: patient has echo 9/22  Respiratory Respiratory Symptoms Reported: No symptoms reported    Endocrine Endocrine Symptoms Reported: No symptoms reported Other symptoms related to hypoglycemia or hyperglycemia: Patient reports dizziness has resolved Is patient diabetic?: Yes Is patient checking blood sugars at home?: Yes List most recent blood sugar readings, include date and time of day: 07/20/24 sugar 217 - in the past week highest  8/17 was 255 /  lowest  137 evening of 8/14 Endocrine Self-Management Outcome: 4  (good)  Gastrointestinal Gastrointestinal Symptoms Reported: No symptoms reported      Genitourinary Genitourinary Symptoms Reported: Incontinence Additional Genitourinary Details: patient saw pcp who collected urine and will advise    Integumentary Integumentary Symptoms Reported: No symptoms reported    Musculoskeletal Musculoskelatal Symptoms Reviewed: Other Other Musculoskeletal Symptoms: right hip radiating to right knee - wearing a brace on right knee - limits activity - will begin outpatient PT Musculoskeletal Self-Management Outcome: 3 (uncertain)      Psychosocial Psychosocial Symptoms Reported: No symptoms reported         Vitals:   07/20/24 1104 07/20/24 1106  BP: 123/89 122/84  Pulse: 100 (!) 101    Medications Reviewed Today     Reviewed by Lauro Shona LABOR, RN (Registered Nurse) on 07/20/24 at 1038  Med List Status: <None>   Medication Order Taking? Sig Documenting Provider Last Dose Status Informant  amLODipine  (NORVASC ) 5 MG tablet [id number] Yes Take 5 mg by mouth daily. [provider]  Active Self           Med Note JERALYN, FLOR[place] A   Fri Jul 15, 2018  3:22 PM)    blood glucose meter kit and supplies [id number] Yes 1 each by Other route as directed. Check 3x/day and as needed for symptoms of high/low blood sugar [provider]  Active   hydrochlorothiazide  (HYDRODIURIL ) 25 MG tablet [id number]  Take 25 mg by mouth daily.  Patient not taking: Reported on 07/20/2024   [provider]  Active  insulin lispro (HUMALOG) 100 UNIT/ML injection [id number] Yes Inject 2-10 Units into the skin 3 (three) times daily before meals.  Patient taking differently: Inject 2-10 Units into the skin 3 (three) times daily before meals. Patient states Dr Atilano changed her dose 07/11/24 after her discharge to states Dr Atilano changed ss to BG 150-199 2 units; 200-249 4 units; 250-299 6 units; 300-349 8 units and 350-400 10 units - Call MD if >400   [provider]  Active   losartan  (COZAAR ) 25 MG tablet [id number] Yes Take 25 mg by mouth daily. [provider]  Active Self  metFORMIN  (GLUCOPHAGE -XR) 500 MG 24 hr tablet [id number] Yes Take 500 mg by mouth daily.  Patient taking differently: Take 500 mg by mouth daily. Per patient Dr Atilano increased to 1000mg  once daily during her 07/19/24 office visit   [provider]  Active Self  nitroGLYCERIN (NITROSTAT) 0.4 MG SL tablet [id number]  Place 0.4 mg under the tongue every 5 (five) minutes as needed for chest pain.  Patient not taking: Reported on 07/20/2024   [provider]  Active   omeprazole (PRILOSEC) 20 MG capsule [id number] Yes Take 20 mg by mouth daily. [provider]  Active Self           Med Note JERALYN, FLORIDA A   Fri Jul 15, 2018  3:22 PM)    rosuvastatin (CRESTOR) 5 MG tablet [id number] Yes Take 5 mg by mouth every other day. [provider]  Active   Semaglutide,0.25 or 0.5MG /DOS, (OZEMPIC, 0.25 OR 0.5 MG/DOSE,) 2 MG/3ML SOPN [id number] Yes Inject 0.5 mg into the skin once a week.  Patient taking differently: Inject 0.5 mg into the skin once a week. Patient reports Dr Atilano told her to take 2 shots during her 07/19/24 office visit - TO start 07/23/24   [provider]  Active             Recommendation:   Continue Current Plan of Care  Follow Up Plan:   Telephone follow up appointment date/time:  07/27/24 4pm  Shona Prow RN, CCM [place]  VBCI-Population Health RN Care Manager [phone]

## 2024-07-20 NOTE — Patient Instructions (Signed)
 Visit Information  Thank you for taking time to visit with me today. Please don't hesitate to contact me if I can be of assistance to you before our next scheduled telephone appointment.  Our next appointment is by telephone on 07/27/24 at 4pm  Following is a copy of your care plan:   Goals Addressed             This Visit's Progress    VBCI Transitions of Care (TOC) Care Plan       Problems:  Recent Hospitalization for treatment of Vertigo/hyperglycemia Patient home with new diabetic kit and states pharmacy did not have time to help - called her PCP who walked her through - [facility] RN walked through instructions again and had patient check during call - patient reported random sugar was 197 - TOC RN and patient walked through sliding scale - TOC RN also educated to make sure family members know how to check her sugar and how to respond to low/high sugar readings and discuss glucagon with PCP  Call lasted more than an hour - TOC RN unable to review all assessments due to time - Will review with future call - completed   Goal:  Over the next 30 days, the patient will not experience hospital readmission  Interventions:  Transitions of Care: Doctor Visits  - discussed the importance of doctor visits 07/20/24: pt states her PCP is retiring and she has another appt 08/17/24 and plans to discuss what is going to happen with his patients 07/20/24: Reviewed BP findings - patient states she received a call from PCP office during our call and is going to call them back to see if it's related to the blood work from 07/18/24 - TOC RN educated patient to make PCP office aware of BP findings and ask for parameters of when to call MD - patient agreeable  Diabetes Interventions: Assessed patient's understanding of A1c goal: <7% Provided education to patient about basic DM disease process Reviewed medications with patient and discussed importance of medication adherence Assessed social determinant of health  barriers Lab Results  Component Value Date   HGBA1C 6.5 (H) 07/22/2018  Patient reports A1C in hospital at [facility] was 10.5 and confirmed with chart  Hypertension Interventions: Last practice recorded BP readings:  BP Readings from Last 3 Encounters:  01/05/24 136/84  12/28/23 112/70  11/26/23 (!) 138/92   Most recent eGFR/CrCl: No results found for: EGFR  No components found for: CRCL  Evaluation of current treatment plan related to hypertension self management and patient's adherence to plan as established by provider Provided education to patient re: stroke prevention, s/s of heart attack and stroke Reviewed medications with patient and discussed importance of compliance Discussed plans with patient for ongoing care management follow up and provided patient with direct contact information for care management team Advised patient, providing education and rationale, to monitor blood pressure daily and record, calling PCP for findings outside established parameters  Patient Self Care Activities:  Attend all scheduled provider appointments Call pharmacy for medication refills 3-7 days in advance of running out of medications Call provider office for new concerns or questions  Notify RN Care Manager of TOC call rescheduling needs Participate in Transition of Care Program/Attend TOC scheduled calls Take medications as prescribed   check blood sugar at prescribed times: three times daily check feet daily for cuts, sores or redness enter blood sugar readings and medication or insulin into daily log take the blood sugar log to all doctor  visits manage portion size write blood pressure results in a log or diary take blood pressure log to all doctor appointments keep all doctor appointments  Plan:  Patient states has appt with cardio, Olivia Pavy - Review ECHO 9/22 and next PCP appt 08/17/24 Telephone follow up appointment with care management team member scheduled for:  07/27/24  4pm The patient has been provided with contact information for the care management team and has been advised to call with any health related questions or concerns.         Patient verbalizes understanding of instructions and care plan provided today and agrees to view in MyChart. Active MyChart status and patient understanding of how to access instructions and care plan via MyChart confirmed with patient.     Telephone follow up appointment with care management team member scheduled for: 07/27/24 4pm The patient has been provided with contact information for the care management team and has been advised to call with any health related questions or concerns.   Please call the care guide team at [phone] if you need to cancel or reschedule your appointment.   Please call the Suicide and Crisis Lifeline: 988 call 1-800-273-TALK (toll free, 24 hour hotline) call 911 if you are experiencing a Mental Health or Behavioral Health Crisis or need someone to talk to.  Shona Prow RN, CCM [place]  VBCI-Population Health RN Care Manager [phone]

## 2024-07-25 DIAGNOSIS — R269 Unspecified abnormalities of gait and mobility: Secondary | ICD-10-CM | POA: Diagnosis not present

## 2024-07-25 DIAGNOSIS — M6281 Muscle weakness (generalized): Secondary | ICD-10-CM | POA: Diagnosis not present

## 2024-07-27 ENCOUNTER — Other Ambulatory Visit: Payer: Self-pay

## 2024-07-27 NOTE — Patient Instructions (Signed)
 Visit Information  Thank you for taking time to visit with me today. Please don't hesitate to contact me if I can be of assistance to you before our next scheduled telephone appointment.  Our next appointment is by telephone on 08/03/24 at 3pm  Following is a copy of your care plan:   Goals Addressed             This Visit's Progress    VBCI Transitions of Care (TOC) Care Plan       Problems:  Recent Hospitalization for treatment of Vertigo/hyperglycemia - patient did not pick up the Meclizine - feels like she doesn't need it - denies dizziness Patient home with new diabetic kit and states pharmacy did not have time to help - called her PCP who walked her through - [facility] RN walked through instructions again and had patient check during call - patient reported random sugar was 197 - TOC RN and patient walked through sliding scale - TOC RN also educated to make sure family members know how to check her sugar and how to respond to low/high sugar readings and discuss glucagon with PCP  Call lasted more than an hour - TOC RN unable to review all assessments due to time - Will review with future call - completed   Goal:  Over the next 30 days, the patient will not experience hospital readmission  Interventions:  Transitions of Care: Doctor Visits  - discussed the importance of doctor visits 07/20/24: pt states her PCP is retiring and she has another appt 08/17/24 and plans to discuss what is going to happen with his patients 07/20/24: Reviewed BP findings - patient states she received a call from PCP office during our call and is going to call them back to see if it's related to the blood work from 07/18/24 - TOC RN educated patient to make PCP office aware of BP findings and ask for parameters of when to call MD - patient agreeable Update 07/27/24: patient reports she called PCP 07/20/24 and reported Bps and MD asked her to check her BPS and today patient states she forgot to do so. Patient checked BP  during call and reports left arm BP 135/96 HR 108 - Patient waited a few minutes and checked right arm  and TOC RN heard the machine frequently restarting - TOC RN educated patient to make sure cuff is not too tight or too loose and make sure she can fit 2 fingers between cuff and her upper arm - patient rechecked BP on right arm and reported 118/87 HR 99 - TOC RN provided education on how MD can best help her manage her BP and the importance of checking and recording and providing MD with results - patient agrees to check at least daily and call MD if HR increases more than the 112 she last reported to MD - Patient reports sugar during call of 136  Diabetes Interventions: Assessed patient's understanding of A1c goal: <7% Provided education to patient about basic DM disease process Reviewed medications with patient and discussed importance of medication adherence Assessed social determinant of health barriers Lab Results  Component Value Date   HGBA1C 6.5 (H) 07/22/2018  Patient reports A1C in hospital at [facility] was 10.5 and confirmed with chart  Hypertension Interventions: Last practice recorded BP readings:  BP Readings from Last 3 Encounters:  01/05/24 136/84  12/28/23 112/70  11/26/23 (!) 138/92   Most recent eGFR/CrCl: No results found for: EGFR  No components found for:  CRCL  Evaluation of current treatment plan related to hypertension self management and patient's adherence to plan as established by provider Provided education to patient re: stroke prevention, s/s of heart attack and stroke Reviewed medications with patient and discussed importance of compliance Discussed plans with patient for ongoing care management follow up and provided patient with direct contact information for care management team Advised patient, providing education and rationale, to monitor blood pressure daily and record, calling PCP for findings outside established parameters  Patient Self Care  Activities:  Attend all scheduled provider appointments Call pharmacy for medication refills 3-7 days in advance of running out of medications Call provider office for new concerns or questions  Notify RN Care Manager of TOC call rescheduling needs Participate in Transition of Care Program/Attend TOC scheduled calls Take medications as prescribed   check blood sugar at prescribed times: three times daily check feet daily for cuts, sores or redness enter blood sugar readings and medication or insulin into daily log take the blood sugar log to all doctor visits manage portion size write blood pressure results in a log or diary take blood pressure log to all doctor appointments keep all doctor appointments  Plan:  Patient states has appt with cardio, Olivia Pavy - Review ECHO 9/22 and next PCP appt 08/17/24 Telephone follow up appointment with care management team member scheduled for:  08/03/24 3pm The patient has been provided with contact information for the care management team and has been advised to call with any health related questions or concerns.         Patient verbalizes understanding of instructions and care plan provided today and agrees to view in MyChart. Active MyChart status and patient understanding of how to access instructions and care plan via MyChart confirmed with patient.     Telephone follow up appointment with care management team member scheduled for:08/03/24 3pm The patient has been provided with contact information for the care management team and has been advised to call with any health related questions or concerns.   Please call the care guide team at [phone] if you need to cancel or reschedule your appointment.   Please call the Suicide and Crisis Lifeline: 988 call 1-800-273-TALK (toll free, 24 hour hotline) call 911 if you are experiencing a Mental Health or Behavioral Health Crisis or need someone to talk to.  Shona Prow RN, CCM [place]   VBCI-Population Health RN Care Manager [phone]

## 2024-07-27 NOTE — Transitions of Care (Post Inpatient/ED Visit) (Signed)
 Transition of Care week 3  Visit Note  07/27/2024  Name: Donna Casey MRN: [id number]          DOB: August 11, 1961  Situation: Patient enrolled in [facility] 30-day program. Visit completed with patient by telephone.   Background: Admit/Discharge Date    8/10 - 8/12 [facility] Primary Diagnosis: Vertigo/hyperglycemia  Initial Transition Care Management Follow-up Telephone Call    Past Medical History:  Diagnosis Date   Anemia    Arthritis    Diabetes mellitus without complication (HCC)    Enlarged liver    Family history of adverse reaction to anesthesia    N/V   Fatty liver    GERD (gastroesophageal reflux disease)    H pylori ulcer    Heart palpitations    Hypertension    Numbness and tingling in hands    right hand   PONV (postoperative nausea and vomiting)    Primary localized osteoarthritis of left knee 08/02/2018    Assessment: Patient Reported Symptoms: Cognitive Cognitive Status: No symptoms reported, Normal speech and language skills, Alert and oriented to person, place, and time      Neurological Neurological Review of Symptoms: No symptoms reported    HEENT HEENT Symptoms Reported: No symptoms reported      Cardiovascular Cardiovascular Symptoms Reported: Other: Other Cardiovascular Symptoms: Educated patient on importance of monitoring BP/HR - patient states she called PCP 07/20/24 and reported readings - today reports 135/96  HR 108 Does patient have uncontrolled Hypertension?: Yes Is patient checking Blood Pressure at home?: Yes Patient's Recent BP reading at home: patient reports right knee swelling has improved - Cardiovascular Comment: patient echo 08/21/24  Respiratory Respiratory Symptoms Reported: No symptoms reported    Endocrine Endocrine Symptoms Reported: No symptoms reported Is patient diabetic?: Yes Is patient checking blood sugars at home?: Yes List most recent blood sugar readings, include date and time of day: 07/27/24 patient reports  sugar today of 136 during call Endocrine Self-Management Outcome: 4 (good)  Gastrointestinal Gastrointestinal Symptoms Reported: Vomiting Additional Gastrointestinal Details: patient reports 1 episode of vominting Monday 07/24/24 - no further vomiting - unsure of cause      Genitourinary Genitourinary Symptoms Reported: Other Other Genitourinary Symptoms: patient states she talked with MD office and was told they were unable to find urine - will need to recheck    Integumentary Integumentary Symptoms Reported: No symptoms reported    Musculoskeletal Musculoskelatal Symptoms Reviewed: Other Additional Musculoskeletal Details: patient states she has pulled out her cane and is using and continues with brace on knee - has begun outpatient PT Musculoskeletal Management Strategies:  (outpatient PT and exercises at home and instructed) Musculoskeletal Self-Management Outcome: 3 (uncertain)      Psychosocial Psychosocial Symptoms Reported: Not assessed         Vitals:   07/27/24 1639  BP: (!) 135/98  Pulse: (!) 108    Medications Reviewed Today     Reviewed by Lauro Shona LABOR, RN (Registered Nurse) on 07/27/24 at 1621  Med List Status: <None>   Medication Order Taking? Sig Documenting Provider Last Dose Status Informant  amLODipine  (NORVASC ) 5 MG tablet [id number] Yes Take 5 mg by mouth daily. [provider]  Active Self           Med Note JERALYN, [place]OR[place] A   Fri Jul 15, 2018  3:22 PM)    blood glucose meter kit and supplies [id number] Yes 1 each by Other route as directed. Check 3x/day and as needed for symptoms  of high/low blood sugar [provider]  Active   hydrochlorothiazide  (HYDRODIURIL ) 25 MG tablet [id number]  Take 25 mg by mouth daily.  Patient not taking: Reported on 07/27/2024   [provider]  Active   insulin lispro (HUMALOG) 100 UNIT/ML injection [id number] Yes Inject 2-10 Units into the skin 3 (three) times daily before meals.  Patient taking  differently: Inject 2-10 Units into the skin 3 (three) times daily before meals. Patient states Dr Atilano changed her dose 07/11/24 after her discharge to states Dr Atilano changed ss to BG 150-199 2 units; 200-249 4 units; 250-299 6 units; 300-349 8 units and 350-400 10 units - Call MD if >400   [provider]  Active   losartan  (COZAAR ) 25 MG tablet [id number] Yes Take 25 mg by mouth daily. [provider]  Active Self  metFORMIN  (GLUCOPHAGE -XR) 500 MG 24 hr tablet [id number] Yes Take 500 mg by mouth daily.  Patient taking differently: Take 500 mg by mouth daily. Per patient Dr Atilano increased to 1000mg  once daily during her 07/19/24 office visit   [provider]  Active Self  nitroGLYCERIN (NITROSTAT) 0.4 MG SL tablet [id number]  Place 0.4 mg under the tongue every 5 (five) minutes as needed for chest pain.  Patient not taking: Reported on 07/27/2024   [provider]  Active   omeprazole (PRILOSEC) 20 MG capsule [id number] Yes Take 20 mg by mouth daily. [provider]  Active Self           Med Note JERALYN, [place]OR[place] A   Fri Jul 15, 2018  3:22 PM)    rosuvastatin (CRESTOR) 5 MG tablet [id number] Yes Take 5 mg by mouth every other day. [provider]  Active   Semaglutide,0.25 or 0.5MG /DOS, (OZEMPIC, 0.25 OR 0.5 MG/DOSE,) 2 MG/3ML SOPN [id number] Yes Inject 0.5 mg into the skin once a week.  Patient taking differently: Inject 0.5 mg into the skin once a week. Patient reports Dr Atilano told her to take 2 shots during her 07/19/24 office visit - TO start 07/23/24   [provider]  Active             Recommendation:   Continue Current Plan of Care  Follow Up Plan:   Telephone follow up appointment date/time:  08/03/24 3pm  Shona Prow RN, CCM [place]  VBCI-Population Health RN Care Manager [phone]

## 2024-08-02 DIAGNOSIS — R269 Unspecified abnormalities of gait and mobility: Secondary | ICD-10-CM | POA: Diagnosis not present

## 2024-08-02 DIAGNOSIS — M6281 Muscle weakness (generalized): Secondary | ICD-10-CM | POA: Diagnosis not present

## 2024-08-03 ENCOUNTER — Other Ambulatory Visit: Payer: Self-pay

## 2024-08-03 ENCOUNTER — Telehealth: Payer: Self-pay

## 2024-08-03 DIAGNOSIS — R300 Dysuria: Secondary | ICD-10-CM | POA: Diagnosis not present

## 2024-08-03 NOTE — Transitions of Care (Post Inpatient/ED Visit) (Signed)
 Transition of Care week 4  Visit Note  08/03/2024  Name: Donna Casey MRN: [id number]          DOB: 09/16/1961  Situation: Patient enrolled in [facility] 30-day program. Visit completed with patient by telephone.   Background: Admit/Discharge Date    8/10 - 8/12 [facility] Primary Diagnosis: Vertigo/hyperglycemia  Initial Transition Care Management Follow-up Telephone Call    Past Medical History:  Diagnosis Date   Anemia    Arthritis    Diabetes mellitus without complication (HCC)    Enlarged liver    Family history of adverse reaction to anesthesia    N/V   Fatty liver    GERD (gastroesophageal reflux disease)    H pylori ulcer    Heart palpitations    Hypertension    Numbness and tingling in hands    right hand   PONV (postoperative nausea and vomiting)    Primary localized osteoarthritis of left knee 08/02/2018    Assessment: Patient Reported Symptoms: Cognitive Cognitive Status: No symptoms reported, Normal speech and language skills, Alert and oriented to person, place, and time      Neurological Neurological Review of Symptoms: No symptoms reported    HEENT HEENT Symptoms Reported: No symptoms reported      Cardiovascular Cardiovascular Symptoms Reported: Swelling in legs or feet Other Cardiovascular Symptoms: patietn has swelling in right knee does not feel it's heart related - apptcardio, Olivia Pavy 9/22 Patient's Recent BP reading at home: 08/03/24 update patient states she has only checked twice since our last call - patient states she is driving but states Left arm 131/73 HR 98 08/02/24 - right arm was 117/80 HR 103 Cardiovascular Self-Management Outcome: 4 (good)  Respiratory Respiratory Symptoms Reported: No symptoms reported    Endocrine Endocrine Symptoms Reported: No symptoms reported Is patient diabetic?: Yes Is patient checking blood sugars at home?: Yes List most recent blood sugar readings, include date and time of day: 08/03/24 patient reports  sugar 141 and reports earlier this week was 118 and 161 one evening - was driving and did not have numbers in front of her    Gastrointestinal Gastrointestinal Symptoms Reported: No symptoms reported Additional Gastrointestinal Details: patient reports no further vomiting      Genitourinary Genitourinary Symptoms Reported: No symptoms reported Other Genitourinary Symptoms: patient states she provided another urine specimen today 08/03/24    Integumentary Integumentary Symptoms Reported: No symptoms reported    Musculoskeletal Musculoskelatal Symptoms Reviewed: Other Other Musculoskeletal Symptoms: Patient reports ongoing right knee pain and states at this time, this is her biggest concern, Patient states she was at PT yesterday at [facility] and therapist stopped PT and said she was reaching out to PCP to ask for Knee xray and possible ortho referral - -Patient states she stopped by PCP office yesterday to make staff aware of this and states a nurse from PCP office called  this morning and patient repeated urine test and advised nurse of xray request Musculoskeletal Self-Management Outcome: 3 (uncertain) Musculoskeletal Comment: Patient states she will call PCP office in the morning if she doesn't hear anything from PCP office re: Xray and will call [facility] RN if needed and otherwise states she is okay for next scheduled call 08/08/24.      Psychosocial           There were no vitals filed for this visit.  Medications Reviewed Today   Medications were not reviewed in this encounter     Recommendation:  Continue Current Plan of Care  Follow Up Plan:   Telephone follow up appointment date/time:  08/08/24 3pm but patient agrees to contact [facility] RN prior to this appointment with any needs/concerns  Shona Prow RN, CCM   VBCI-Population Health RN Care Manager [phone]

## 2024-08-03 NOTE — Patient Instructions (Signed)
 Visit Information  Thank you for taking time to visit with me today. Please don't hesitate to contact me if I can be of assistance to you before our next scheduled telephone appointment.  Our next appointment is by telephone on 08/08/24 at 3pm  Following is a copy of your care plan:   Goals Addressed             This Visit's Progress    VBCI Transitions of Care (TOC) Care Plan       Problems:  Recent Hospitalization for treatment of Vertigo/hyperglycemia - patient did not pick up the Meclizine - feels like she doesn't need it - denies dizziness Patient home with new diabetic kit and states pharmacy did not have time to help - called her PCP who walked her through - [facility] RN walked through instructions again and had patient check during call - patient reported random sugar was 197 - TOC RN and patient walked through sliding scale - TOC RN also educated to make sure family members know how to check her sugar and how to respond to low/high sugar readings and discuss glucagon with PCP  Call lasted more than an hour - TOC RN unable to review all assessments due to time - Will review with future call - completed   Goal:  Over the next 30 days, the patient will not experience hospital readmission  Interventions:  Transitions of Care: Doctor Visits  - discussed the importance of doctor visits 07/20/24: pt states her PCP is retiring and she has another appt 08/17/24 and plans to discuss what is going to happen with his patients 07/20/24: Reviewed BP findings - patient states she received a call from PCP office during our call and is going to call them back to see if it's related to the blood work from 07/18/24 - TOC RN educated patient to make PCP office aware of BP findings and ask for parameters of when to call MD - patient agreeable Update 07/27/24: patient reports she called PCP 07/20/24 and reported Bps and MD asked her to check her BPS and today patient states she forgot to do so. Patient checked BP  during call and reports left arm BP 135/96 HR 108 - Patient waited a few minutes and checked right arm  and TOC RN heard the machine frequently restarting - TOC RN educated patient to make sure cuff is not too tight or too loose and make sure she can fit 2 fingers between cuff and her upper arm - patient rechecked BP on right arm and reported 118/87 HR 99 - TOC RN provided education on how MD can best help her manage her BP and the importance of checking and recording and providing MD with results - patient agrees to check at least daily and call MD if HR increases more than the 112 she last reported to MD - Patient reports sugar during call of 136 08/03/24 Update: Patient states biggest issue currently is right knee pain - awaiting call back from PCP re: xray as requested by her PT  Diabetes Interventions: Assessed patient's understanding of A1c goal: <7% Provided education to patient about basic DM disease process Reviewed medications with patient and discussed importance of medication adherence Assessed social determinant of health barriers Lab Results  Component Value Date   HGBA1C 6.5 (H) 07/22/2018  Patient reports A1C in hospital at [facility] was 10.5 and confirmed with chart  Hypertension Interventions: Last practice recorded BP readings:  BP Readings from Last 3 Encounters:  01/05/24 136/84  12/28/23 112/70  11/26/23 (!) 138/92   Most recent eGFR/CrCl: No results found for: EGFR  No components found for: CRCL  Evaluation of current treatment plan related to hypertension self management and patient's adherence to plan as established by provider Provided education to patient re: stroke prevention, s/s of heart attack and stroke Reviewed medications with patient and discussed importance of compliance Discussed plans with patient for ongoing care management follow up and provided patient with direct contact information for care management team Advised patient, providing education and  rationale, to monitor blood pressure daily and record, calling PCP for findings outside established parameters  Patient Self Care Activities:  Attend all scheduled provider appointments Call pharmacy for medication refills 3-7 days in advance of running out of medications Call provider office for new concerns or questions  Notify RN Care Manager of TOC call rescheduling needs Participate in Transition of Care Program/Attend TOC scheduled calls Take medications as prescribed   check blood sugar at prescribed times: three times daily check feet daily for cuts, sores or redness enter blood sugar readings and medication or insulin into daily log take the blood sugar log to all doctor visits manage portion size write blood pressure results in a log or diary take blood pressure log to all doctor appointments keep all doctor appointments  Plan:  Patient states has appt with cardio, Olivia Pavy - Review ECHO 9/22 and next PCP appt 08/17/24 Telephone follow up appointment with care management team member scheduled for:  08/08/24 3pm The patient has been provided with contact information for the care management team and has been advised to call with any health related questions or concerns.         Patient verbalizes understanding of instructions and care plan provided today and agrees to view in MyChart. Active MyChart status and patient understanding of how to access instructions and care plan via MyChart confirmed with patient.     Telephone follow up appointment with care management team member scheduled for:08/08/24 3pm The patient has been provided with contact information for the care management team and has been advised to call with any health related questions or concerns.   Please call the care guide team at [phone] if you need to cancel or reschedule your appointment.   Please call the Suicide and Crisis Lifeline: 988 call 1-800-273-TALK (toll free, 24 hour hotline) call 911 if  you are experiencing a Mental Health or Behavioral Health Crisis or need someone to talk to.  Shona Prow RN, CCM [place]  VBCI-Population Health RN Care Manager [phone]

## 2024-08-08 ENCOUNTER — Other Ambulatory Visit: Payer: Self-pay

## 2024-08-08 NOTE — Transitions of Care (Post Inpatient/ED Visit) (Signed)
 Transition of Care Week #5  Visit Note  08/08/2024  Name: Donna Casey MRN: [id number]          DOB: 02-02-1961  Situation: Patient enrolled in [facility] 30-day program. Visit completed with patient by telephone.   Background: Admit/Discharge Date    8/10 - 8/12 [facility] Primary Diagnosis: Vertigo/hyperglycemia  Initial Transition Care Management Follow-up Telephone Call    Past Medical History:  Diagnosis Date   Anemia    Arthritis    Diabetes mellitus without complication (HCC)    Enlarged liver    Family history of adverse reaction to anesthesia    N/V   Fatty liver    GERD (gastroesophageal reflux disease)    H pylori ulcer    Heart palpitations    Hypertension    Numbness and tingling in hands    right hand   PONV (postoperative nausea and vomiting)    Primary localized osteoarthritis of left knee 08/02/2018    Assessment: Patient Reported Symptoms: Cognitive Cognitive Status: Alert and oriented to person, place, and time, Normal speech and language skills, No symptoms reported      Neurological Neurological Review of Symptoms: No symptoms reported    HEENT HEENT Symptoms Reported: No symptoms reported      Cardiovascular Cardiovascular Symptoms Reported: Not assessed    Respiratory Respiratory Symptoms Reported: Not assesed    Endocrine Endocrine Symptoms Reported: No symptoms reported Is patient diabetic?: Yes Is patient checking blood sugars at home?: Yes List most recent blood sugar readings, include date and time of day: Patient reports blood sugar this morning of 139    Gastrointestinal Gastrointestinal Symptoms Reported: Not assessed      Genitourinary Other Genitourinary Symptoms: Patient states she was advised that the new specimen showed no UTI    Integumentary Integumentary Symptoms Reported: No symptoms reported    Musculoskeletal Musculoskelatal Symptoms Reviewed: Other Other Musculoskeletal Symptoms: Patient and TOC RN made conference  call to PCP, Dr Cindia office regarding knee pain - left message asking if something, maybe an anti-inflammatory could be prescribed and requested call back either to patient or TOC RN        Psychosocial Psychosocial Symptoms Reported: Not assessed         There were no vitals filed for this visit.  Medications Reviewed Today     Reviewed by Lauro Shona LABOR, RN (Registered Nurse) on 08/08/24 at 1511  Med List Status: <None>   Medication Order Taking? Sig Documenting Provider Last Dose Status Informant  amLODipine  (NORVASC ) 5 MG tablet [id number] Yes Take 5 mg by mouth daily. [provider]  Active Self           Med Note JERALYN, FLORIDA A   Fri Jul 15, 2018  3:22 PM)    blood glucose meter kit and supplies [id number] Yes 1 each by Other route as directed. Check 3x/day and as needed for symptoms of high/low blood sugar [provider]  Active   hydrochlorothiazide  (HYDRODIURIL ) 25 MG tablet [id number]  Take 25 mg by mouth daily.  Patient not taking: Reported on 08/08/2024   [provider]  Active   insulin lispro (HUMALOG) 100 UNIT/ML injection [id number] Yes Inject 2-10 Units into the skin 3 (three) times daily before meals.  Patient taking differently: Inject 2-10 Units into the skin 3 (three) times daily before meals. Patient states Dr Atilano changed her dose 07/11/24 after her discharge to states Dr Atilano changed ss to BG 150-199 2 units; 200-249 4  units; 250-299 6 units; 300-349 8 units and 350-400 10 units - Call MD if >400   [provider]  Active   losartan  (COZAAR ) 25 MG tablet [id number] Yes Take 25 mg by mouth daily. [provider]  Active Self  metFORMIN  (GLUCOPHAGE -XR) 500 MG 24 hr tablet [id number] Yes Take 500 mg by mouth daily.  Patient taking differently: Take 500 mg by mouth daily. Per patient Dr Atilano increased to 1000mg  once daily during her 07/19/24 office visit   [provider]  Active Self  nitroGLYCERIN  (NITROSTAT) 0.4 MG SL tablet [id number]  Place 0.4 mg under the tongue every 5 (five) minutes as needed for chest pain.  Patient not taking: Reported on 08/08/2024   [provider]  Active   omeprazole (PRILOSEC) 20 MG capsule [id number] Yes Take 20 mg by mouth daily. [provider]  Active Self           Med Note JERALYN, FLORIDA A   Fri Jul 15, 2018  3:22 PM)    rosuvastatin (CRESTOR) 5 MG tablet [id number] Yes Take 5 mg by mouth every other day. [provider]  Active   Semaglutide,0.25 or 0.5MG /DOS, (OZEMPIC, 0.25 OR 0.5 MG/DOSE,) 2 MG/3ML SOPN [id number] Yes Inject 0.5 mg into the skin once a week. [provider]  Active             Recommendation:   Follow up with PCP office regarding pain in knee  Follow Up Plan:   Telephone follow up appointment date/time:  08/14/24 2pm  Shona Prow RN, CCM [place]  VBCI-Population Health RN Care Manager [phone]

## 2024-08-08 NOTE — Patient Instructions (Signed)
 Visit Information  Thank you for taking time to visit with me today. Please don't hesitate to contact me if I can be of assistance to you before our next scheduled telephone appointment.  Our next appointment is by telephone on 08/14/24 at 2pm  Following is a copy of your care plan:   Goals Addressed             This Visit's Progress    VBCI Transitions of Care (TOC) Care Plan       Problems:  Recent Hospitalization for treatment of Vertigo/hyperglycemia - patient did not pick up the Meclizine - feels like she doesn't need it - denies dizziness Patient home with new diabetic kit and states pharmacy did not have time to help - called her PCP who walked her through - [facility] RN walked through instructions again and had patient check during call - patient reported random sugar was 197 - TOC RN and patient walked through sliding scale - TOC RN also educated to make sure family members know how to check her sugar and how to respond to low/high sugar readings and discuss glucagon with PCP  Call lasted more than an hour - TOC RN unable to review all assessments due to time - Will review with future call - completed on 08/08/24: Patient was in the car with husband driving during today's call Not all assessments were completed   Goal:  Over the next 30 days, the patient will not experience hospital readmission  Interventions:  Transitions of Care: Doctor Visits  - discussed the importance of doctor visits 07/20/24: pt states her PCP is retiring and she has another appt 08/17/24 and plans to discuss what is going to happen with his patients 07/20/24: Reviewed BP findings - patient states she received a call from PCP office during our call and is going to call them back to see if it's related to the blood work from 07/18/24 - TOC RN educated patient to make PCP office aware of BP findings and ask for parameters of when to call MD - patient agreeable Update 07/27/24: patient reports she called PCP 07/20/24 and  reported Bps and MD asked her to check her BPS and today patient states she forgot to do so. Patient checked BP during call and reports left arm BP 135/96 HR 108 - Patient waited a few minutes and checked right arm  and TOC RN heard the machine frequently restarting - TOC RN educated patient to make sure cuff is not too tight or too loose and make sure she can fit 2 fingers between cuff and her upper arm - patient rechecked BP on right arm and reported 118/87 HR 99 - TOC RN provided education on how MD can best help her manage her BP and the importance of checking and recording and providing MD with results - patient agrees to check at least daily and call MD if HR increases more than the 112 she last reported to MD - Patient reports sugar during call of 136 08/03/24 Update: Patient states biggest issue currently is right knee pain - awaiting call back from PCP re: xray as requested by her PT 08/08/24: Patient in car with her husband driving. Patient states she received a call regarding results of urine and states no UTI - Patient states she continues with right knee pain and asked nurse about something for pain but had not heard back. TOC RN and patient made a conference call to PCP office and left message asking if  MD would prescribed something (maybe an anti-inflammatory) for patients knee swelling and pain. Patient agrees to call [facility] RN if she doesn't hear back and TOC RN will follow up 08/14/24 and discuss plan for closing TOC program   Diabetes Interventions: Assessed patient's understanding of A1c goal: <7% Provided education to patient about basic DM disease process Reviewed medications with patient and discussed importance of medication adherence Assessed social determinant of health barriers Lab Results  Component Value Date   HGBA1C 6.5 (H) 07/22/2018  Patient reports A1C in hospital at [facility] was 10.5 and confirmed with chart  Hypertension Interventions: Last practice recorded BP readings:  BP  Readings from Last 3 Encounters:  01/05/24 136/84  12/28/23 112/70  11/26/23 (!) 138/92   Most recent eGFR/CrCl: No results found for: EGFR  No components found for: CRCL  Evaluation of current treatment plan related to hypertension self management and patient's adherence to plan as established by provider Provided education to patient re: stroke prevention, s/s of heart attack and stroke Reviewed medications with patient and discussed importance of compliance Discussed plans with patient for ongoing care management follow up and provided patient with direct contact information for care management team Advised patient, providing education and rationale, to monitor blood pressure daily and record, calling PCP for findings outside established parameters  Patient Self Care Activities:  Attend all scheduled provider appointments Call pharmacy for medication refills 3-7 days in advance of running out of medications Call provider office for new concerns or questions  Notify RN Care Manager of TOC call rescheduling needs Participate in Transition of Care Program/Attend TOC scheduled calls Take medications as prescribed   check blood sugar at prescribed times: three times daily check feet daily for cuts, sores or redness enter blood sugar readings and medication or insulin into daily log take the blood sugar log to all doctor visits manage portion size write blood pressure results in a log or diary take blood pressure log to all doctor appointments keep all doctor appointments  Plan:  Patient states has appt with cardio, Olivia Pavy P.A. - Review ECHO 9/22 and next PCP appt 08/17/24 Telephone follow up appointment with care management team member scheduled for:  08/14/24 2pm The patient has been provided with contact information for the care management team and has been advised to call with any health related questions or concerns.         Patient verbalizes understanding of  instructions and care plan provided today and agrees to view in MyChart. Active MyChart status and patient understanding of how to access instructions and care plan via MyChart confirmed with patient.     Telephone follow up appointment with care management team member scheduled for: 08/14/24 2pm The patient has been provided with contact information for the care management team and has been advised to call with any health related questions or concerns.   Please call the care guide team at [phone] if you need to cancel or reschedule your appointment.   Please call the Suicide and Crisis Lifeline: 988 call 1-800-273-TALK (toll free, 24 hour hotline) call 911 if you are experiencing a Mental Health or Behavioral Health Crisis or need someone to talk to. Shona Prow RN, CCM [place]  VBCI-Population Health RN Care Manager [phone]

## 2024-08-09 NOTE — Transitions of Care (Post Inpatient/ED Visit) (Signed)
 Patient ID: KESTREL MIS, female   DOB: [birth year], 63 y.o.   MRN: [id number] 08/09/2024: [facility] RN received a voice message from patient stating she received a call from Dr Cindia office and they are referring her to [facility] Beers, Dr Fonda Olmsted (who previously did her hip surgery) and no medication was prescribed and states they said to discuss pain management with ortho. Patient thanked [facility] RN for the help. Shona Prow RN, CCM [place]  VBCI-Population Health RN Care Manager [phone]

## 2024-08-10 DIAGNOSIS — E1122 Type 2 diabetes mellitus with diabetic chronic kidney disease: Secondary | ICD-10-CM | POA: Diagnosis not present

## 2024-08-10 DIAGNOSIS — R300 Dysuria: Secondary | ICD-10-CM | POA: Diagnosis not present

## 2024-08-10 DIAGNOSIS — I10 Essential (primary) hypertension: Secondary | ICD-10-CM | POA: Diagnosis not present

## 2024-08-10 DIAGNOSIS — N1831 Chronic kidney disease, stage 3a: Secondary | ICD-10-CM | POA: Diagnosis not present

## 2024-08-14 ENCOUNTER — Telehealth

## 2024-08-15 ENCOUNTER — Telehealth: Payer: Self-pay

## 2024-08-15 NOTE — Progress Notes (Signed)
 Cardiology Office Note:  .   Date:  08/21/2024  ID:  Donna Casey, DOB 26-Apr-1961, MRN [id number] PCP: Atilano Deward ORN, MD  [facility] Providers Cardiologist:  Shelda Bruckner, MD    History of Present Illness: .   Donna Casey is a 63 y.o. female  with history of type II diabetes, hypertension, chest pain and tachycardia.     CV history: referred for chest pain and tachycardia. Cardiac PET and monitor 12/2023 reassuring. With T2D, on Ozempic. Has had diabetes for 3-4 years. Never smoker. Has had hypertension for at least several years as well.    Family history: mother had heart disease, started having symptoms in her 64s. Had CABG around age 6. No one else in her family with known heart disease.    Was seen in the [facility] hospital by cardiology 06/2024 for atypical chest pain, echo normal LVEF. Also intractable dizziness, CT and MRI ? Migraines improved with IV fluids, UTI, hyperglycemia glucose >500.  Patient comes for f/u. She thinks her chest pain is musculoskeletal since she is using her upper body to get up/down because of knee pain and trouble with prior hip replacement. Not able to exercise.   ROS:    Studies Reviewed: SABRA         Prior CV Studies:   Echocardiogram 07/10/2024 [facility] Summary   1. The left ventricle is normal in size with normal wall thickness.   2. The left ventricular systolic function is normal, LVEF is visually estimated at > 55%.   3. The right ventricle is normal in size, with normal systolic function.   Cardiac event monitor 12/21/2023 [facility]. Patient had a min HR of 67 bpm, max HR of 168 bpm, and avg HR of 98 bpm. Predominant underlying rhythm was Sinus Rhythm. 2 Supraventricular Tachycardia runs occurred, the run with the fastest interval lasting 5 beats with a max rate of 162 bpm, the longest lasting 6 beats with an avg rate of 137 bpm. No VT, atrial fibrillation, high degree block, or pauses noted. Isolated  atrial and ventricular ectopy was rare (<1%). There were 10 triggered events, which were sinus. No high risk arrhythmias detected.     Exam End: 12/21/23 17:59    Specimen Collected: 11/26/23 04:00 Last Resulted: 01/05/24 12:47  Received From: [facility]      PET/CT 12/28/2023 [facility] stress  [facility]  The study is grossly normal. The study is overall low risk. Borderline  reduced myocardial blood flow reserve may represent microvascular  dysfunction. Reduced myocardial blood flow reserve is less likely to  represent multivessel CAD in the setting of no coronary calcifications, no  TID, normal perfusion and normal augmentation of EF.    LV perfusion is normal. There is no evidence of ischemia. There is no  evidence of infarction.    Rest left ventricular function is normal. Rest EF: 54%. Stress left  ventricular function is normal. Stress EF: 60%. End diastolic cavity size  is normal. End systolic cavity size is normal. No evidence of transient  ischemic dilation (TID) noted.    Myocardial blood flow was computed to be 1.45ml/g/min at rest and  2.67ml/g/min at stress. Global myocardial blood flow reserve was 1.95 and  was mildly abnormal.    Coronary calcium was absent on the attenuation correction CT images.    Electronically signed by: Soyla DELENA Merck, MD   CLINICAL DATA:  This over-read does not include interpretation of  cardiac or  coronary anatomy or pathology. The Cardiac PET CT  interpretation by the cardiologist is attached.   COMPARISON:  None Available.   FINDINGS:  Cardiovascular: No significant vascular findings. Normal heart size.  No pericardial effusion.   Limited Mediastinum/Nodes: No enlarged mediastinal, hilar, or  axillary lymph nodes. Trachea and esophagus demonstrate no  significant findings.   Limited Lungs/Pleura: Lungs are clear. No pleural effusion or  pneumothorax.   Upper Abdomen: No acute abnormality.  Hepatic steatosis.    Musculoskeletal: No chest wall abnormality. No acute osseous  findings.   IMPRESSION:  1. No acute abnormality of the included chest.  2. Hepatic steatosis.    Electronically Signed    By: Marolyn JONETTA Jaksch M.D.    On: 12/28/2023 09:07  Calcium Scoring  Coronary calcium was absent on the attenuation correction CT images.   Mole  Prior study not available for comparison.   Resting ECG  ECG rhythm shows normal sinus rhythm.   Stress Findings  The patient reported no symptoms during the stress test.   Stress ECG  No ST deviation was noted. ECG was interpretable and non-conclusive. The ECG was not diagnostic due to pharmacologic protocol.   Isotope Administration  Standard PET/CT myocardial perfusion imaging was performed. Rest images were obtained after injection of 30.0 mCi Rb-82. A pharmacological stress test was performed using 0.4 mg IV regadenoson  under the supervision of Cardiology Staff with continuous EKG monitoring. At peak effect of the stress agent, an injection of 30.0 mCi Rb-82 was performed followed by acquisition of stress images. CT images were obtained for attenuation correction and were examined for the presence of coronary calcium when appropriate. Myocardial blood flow was quantified using rest and stress data.   Nuclear Study Quality  Overall image quality is good. There are no nuclear artifacts present.   Stress Combined Conclusion  The study is normal. The study is low risk.   Perfusion/Defect  LV perfusion is normal. There is no evidence of ischemia. There is no evidence of infarction.   Myocardial Bood Flow  Myocardial blood flow was computed to be 1.64ml/g/min at rest and 2.40ml/g/min at stress. Global myocardial blood flow reserve was 1.95 and was mildly abnormal.   Ejection Fraction  Rest left ventricular function is normal. Rest EF: 54%. Stress left ventricular function is normal. Stress EF: 60%. End diastolic cavity size is normal. End systolic  cavity size is normal. No evidence of transient ischemic dilation (TID) noted.   Wall Scoring Resting  Score Index: 1.00  The left ventricular wall motion is normal.   Wall Scoring Stress  Score Index: 1.00  The left ventricular wall motion is normal.   Perfusion Scoring Stress  Summed Score: 0 Percent Normal: 0.00%  The left ventricular perfusion is normal.   Perfusion Scoring Resting  Summed Score: 0 Percent Normal: 0.00%  The left ventricular perfusion is normal.  Perfusion Scores: SRS Score: 0 Percentage Abnormal: 0.00%  Perfusion Scores: SSS Score: 0 Percentage Abnormal: 0.00%  Perfusion Scores: SDS Score: 0 Percentage Abnormal: 0.00%     Risk Assessment/Calculations:             Physical Exam:   VS:  BP 106/70 (BP Location: Right Arm, Patient Position: Sitting, Cuff Size: Normal)   Pulse 93   Ht 5' 9 (1.753 m)   Wt 246 lb (111.6 kg)   SpO2 96%   BMI 36.33 kg/m    Orhtostatics: No data found. Wt Readings from Last 3 Encounters:  08/21/24 246 lb (  111.6 kg)  01/05/24 254 lb 11.2 oz (115.5 kg)  11/26/23 256 lb 4.8 oz (116.3 kg)    GEN: Obese, in no acute distress NECK: No JVD; No carotid bruits CARDIAC:  RRR, no murmurs, rubs, gallops RESPIRATORY:  Clear to auscultation without rales, wheezing or rhonchi  ABDOMEN: Soft, non-tender, non-distended EXTREMITIES:  No edema; No deformity   ASSESSMENT AND PLAN: .    History of chest pain-normal PET/CT 12/2023-recent admission to [facility] for chest pain negative w/u most likely M-S chest pain due to ortho problems.  Palpitations-2 SVT runs 5-6 beats  HTN-BP running low. Now off hydrochlorothiazide . Continue amlodipine  and losartan   DM2-check BS daily now that A1C was up to 10.5, now down to 9.9        Dispo: f/u as needed   Signed, Olivia Pavy, PA-C

## 2024-08-15 NOTE — Transitions of Care (Post Inpatient/ED Visit) (Signed)
  Transition of Care [facility] program closure  Visit Note  08/15/2024  Name: Donna Casey MRN: [id number]          DOB: [birth year]  Situation: Patient enrolled in [facility] 30-day program. Visit completed with patient by telephone.   Background: Admit/Discharge Date    8/10 - 8/12 [facility] Primary Diagnosis: Vertigo/hyperglycemia  Initial Transition Care Management Follow-up Telephone Call    Past Medical History:  Diagnosis Date   Anemia    Arthritis    Diabetes mellitus without complication (HCC)    Enlarged liver    Family history of adverse reaction to anesthesia    N/V   Fatty liver    GERD (gastroesophageal reflux disease)    H pylori ulcer    Heart palpitations    Hypertension    Numbness and tingling in hands    right hand   PONV (postoperative nausea and vomiting)    Primary localized osteoarthritis of left knee 08/02/2018    Assessment:Patent denied any changes - unable to update assessments Patient Reported Symptoms:There were no vitals filed for this visit.  Medications Reviewed Today   Medications were not reviewed in this encounter   Patient states no medication changes and no need to review medication list  Recommendation:   Continue to follow with MDs  Follow Up Plan:   Closing From:  Transitions of Care Program Shona Prow RN, CCM [facility]  VBCI-Population Health RN Care Manager [phone]

## 2024-08-16 DIAGNOSIS — M1711 Unilateral primary osteoarthritis, right knee: Secondary | ICD-10-CM | POA: Diagnosis not present

## 2024-08-16 DIAGNOSIS — M25551 Pain in right hip: Secondary | ICD-10-CM | POA: Diagnosis not present

## 2024-08-17 DIAGNOSIS — E1165 Type 2 diabetes mellitus with hyperglycemia: Secondary | ICD-10-CM | POA: Diagnosis not present

## 2024-08-17 DIAGNOSIS — E1122 Type 2 diabetes mellitus with diabetic chronic kidney disease: Secondary | ICD-10-CM | POA: Diagnosis not present

## 2024-08-17 DIAGNOSIS — Z23 Encounter for immunization: Secondary | ICD-10-CM | POA: Diagnosis not present

## 2024-08-17 DIAGNOSIS — I10 Essential (primary) hypertension: Secondary | ICD-10-CM | POA: Diagnosis not present

## 2024-08-21 ENCOUNTER — Ambulatory Visit: Attending: Physician Assistant | Admitting: Physician Assistant

## 2024-08-21 ENCOUNTER — Encounter: Payer: Self-pay | Admitting: Physician Assistant

## 2024-08-21 VITALS — BP 106/70 | HR 93 | Ht 69.0 in | Wt 246.0 lb

## 2024-08-21 DIAGNOSIS — R002 Palpitations: Secondary | ICD-10-CM | POA: Diagnosis not present

## 2024-08-21 DIAGNOSIS — I10 Essential (primary) hypertension: Secondary | ICD-10-CM

## 2024-08-21 DIAGNOSIS — R0789 Other chest pain: Secondary | ICD-10-CM | POA: Diagnosis not present

## 2024-08-21 DIAGNOSIS — E119 Type 2 diabetes mellitus without complications: Secondary | ICD-10-CM | POA: Diagnosis not present

## 2024-08-21 NOTE — Patient Instructions (Signed)
Medication Instructions:   Your physician recommends that you continue on your current medications as directed. Please refer to the Current Medication list given to you today.  *If you need a refill on your cardiac medications before your next appointment, please call your pharmacy*   Follow-Up:  As needed

## 2024-08-23 DIAGNOSIS — M1711 Unilateral primary osteoarthritis, right knee: Secondary | ICD-10-CM | POA: Diagnosis not present

## 2024-08-30 DIAGNOSIS — M1711 Unilateral primary osteoarthritis, right knee: Secondary | ICD-10-CM | POA: Diagnosis not present

## 2024-09-05 NOTE — Progress Notes (Signed)
 Donna Casey                                          MRN: [id number]   09/05/2024   The VBCI Quality Team Specialist reviewed this patient medical record for the purposes of chart review for care gap closure. The following were reviewed: chart review for care gap closure-kidney health evaluation for diabetes:uACR.    VBCI Quality Team

## 2024-09-06 DIAGNOSIS — M1711 Unilateral primary osteoarthritis, right knee: Secondary | ICD-10-CM | POA: Diagnosis not present

## 2024-09-13 DIAGNOSIS — M1711 Unilateral primary osteoarthritis, right knee: Secondary | ICD-10-CM | POA: Diagnosis not present
